# Patient Record
Sex: Female | Born: 1995 | Race: Black or African American | Hispanic: No | Marital: Single | Smoking: Never smoker
Health system: Southern US, Community
[De-identification: ages and names within clinical notes are randomized; demographics above are authoritative.]

## PROBLEM LIST (undated history)

## (undated) DIAGNOSIS — D573 Sickle-cell trait: Secondary | ICD-10-CM

## (undated) DIAGNOSIS — Z5189 Encounter for other specified aftercare: Secondary | ICD-10-CM

## (undated) HISTORY — PX: HERNIA REPAIR: SHX51

---
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
## 2015-12-17 ENCOUNTER — Emergency Department (HOSPITAL_COMMUNITY): Payer: Commercial Managed Care - HMO

## 2015-12-17 ENCOUNTER — Encounter (HOSPITAL_COMMUNITY): Payer: Self-pay | Admitting: Emergency Medicine

## 2015-12-17 ENCOUNTER — Emergency Department (HOSPITAL_COMMUNITY)
Admission: EM | Admit: 2015-12-17 | Discharge: 2015-12-17 | Disposition: A | Discharge status: Home / Self Care | Payer: Commercial Managed Care - HMO | Attending: Physician Assistant | Admitting: Physician Assistant

## 2015-12-17 DIAGNOSIS — J069 Acute upper respiratory infection, unspecified: Secondary | ICD-10-CM | POA: Insufficient documentation

## 2015-12-17 DIAGNOSIS — R509 Fever, unspecified: Secondary | ICD-10-CM | POA: Diagnosis present

## 2015-12-17 DIAGNOSIS — Z862 Personal history of diseases of the blood and blood-forming organs and certain disorders involving the immune mechanism: Secondary | ICD-10-CM | POA: Insufficient documentation

## 2015-12-17 LAB — RAPID STREP SCREEN (MED CTR MEBANE ONLY): STREPTOCOCCUS, GROUP A SCREEN (DIRECT): NEGATIVE

## 2015-12-17 LAB — MONONUCLEOSIS SCREEN: MONO SCREEN: NEGATIVE

## 2015-12-17 MED ORDER — ONDANSETRON HCL 4 MG PO TABS: 4.0000 mg | ORAL_TABLET | Freq: Four times a day (QID) | ORAL | Status: DC

## 2015-12-17 MED ORDER — HYDROCODONE-HOMATROPINE 5-1.5 MG/5ML PO SYRP: 5.0000 mL | ORAL_SOLUTION | Freq: Four times a day (QID) | ORAL | Status: DC | PRN

## 2015-12-17 MED ORDER — HYDROCODONE-HOMATROPINE 5-1.5 MG/5ML PO SYRP
5.0000 mL | ORAL_SOLUTION | Freq: Once | ORAL | Status: AC
  Administered 2015-12-17: 5 mL via ORAL
  Filled 2015-12-17: qty 5

## 2015-12-17 NOTE — ED Provider Notes (Signed)
CSN: [id number]     Arrival date & time 12/17/15  1105 History   First MD Initiated Contact with Patient 12/17/15 1111     Chief Complaint  Patient presents with  . Fever   (Consider location/radiation/quality/duration/timing/severity/associated sxs/prior Treatment) The history is provided by the patient and a parent. No language interpreter was used.  Ms. Knipe is a 19 y.o female with a history of sickle cell trait who presents for intermittent fever, productive white sputum cough, congestion, bodyaches, and one episode of vomiting a couple days ago. Mom states she has been sick for 2 long now. She also reports a sore throat. She is trying Mucinex, NyQuil, and cough drops with minimal relief. She states she does not get her menstrual cycle since she is on the NuvaRing. She reports multiple sick contacts at work. She denies any history of asthma or smoking.   Past Medical History  Diagnosis Date  . Sickle cell trait ([place])   . Blood transfusion without reported diagnosis    Past Surgical History  Procedure Laterality Date  . Hernia repair    . R ovanian dermoid cyst removed  06/10/13   No family history on file. Social History  Substance Use Topics  . Smoking status: Never Smoker   . Smokeless tobacco: None  . Alcohol Use: No   OB History    No data available     Review of Systems  Constitutional: Positive for fever.  HENT: Positive for sore throat.   Respiratory: Positive for cough. Negative for shortness of breath and wheezing.       Allergies  Review of patient's allergies indicates no known allergies.  Home Medications   Prior to Admission medications   Medication Sig Start Date End Date Taking? Authorizing Provider  etonogestrel-ethinyl estradiol (NUVARING) 0.12-0.015 MG/24HR vaginal ring Place 1 each vaginally every 28 (twenty-eight) days. 04/19/15  Yes Historical Provider, MD  Alpha-D-Galactosidase Satira Mccallum) TABS Take 1 tablet by mouth daily. Patient not taking:  Reported on 12/17/2015 06/12/13   Hazel Sams, PA-C  HYDROcodone-homatropine [facility]) 5-1.5 MG/5ML syrup Take 5 mLs by mouth every 6 (six) hours as needed for cough. 12/17/15   Lindia Garms Patel-Mills, PA-C  ondansetron (ZOFRAN) 4 MG tablet Take 1 tablet (4 mg total) by mouth every 6 (six) hours. 12/17/15   Anselm Aumiller Patel-Mills, PA-C  oxyCODONE-acetaminophen (PERCOCET/ROXICET) 5-325 MG per tablet Take 1 tablet by mouth every 4 (four) hours as needed for pain. Patient not taking: Reported on 12/17/2015 05/23/13   Virgel Manifold, MD  polyethylene glycol powder (GLYCOLAX/MIRALAX) powder Take 17 g by mouth daily. Patient not taking: Reported on 12/17/2015 06/12/13   Hazel Sams, PA-C   BP 97/81 mmHg  Pulse 98  Temp(Src) 98.4 F (36.9 C) (Oral)  Resp 20  SpO2 98% Physical Exam  Constitutional: She is oriented to person, place, and time. She appears well-developed and well-nourished.  HENT:  Head: Normocephalic and atraumatic.  No tonsillitis. Oropharynx is clear and moist. No tonsillar or oropharyngeal exudates. Uvula is midline. No trismus or hot potato voice.  Left anterior cervical lymphadenopathy that is tender on exam.  Bilateral TMs are normal.  Eyes: Conjunctivae are normal.  Neck: Normal range of motion. Neck supple.  Cardiovascular: Normal rate, regular rhythm and normal heart sounds.   Regular rate and rhythm. No murmur.  Pulmonary/Chest: Effort normal and breath sounds normal. No respiratory distress. She has no wheezes. She has no rales.  Lungs clear to auscultation bilaterally. No respiratory distress or accessory muscle usage. She  is speaking full sentences.  Abdominal: Soft. There is no tenderness.  Abdomen is soft and nontender.  Musculoskeletal: Normal range of motion.  Neurological: She is alert and oriented to person, place, and time.  Skin: Skin is warm and dry.  Nursing note and vitals reviewed.   ED Course  Procedures (including critical care time) Labs Review Labs  Reviewed  RAPID STREP SCREEN (NOT AT [facility])  CULTURE, GROUP A STREP  MONONUCLEOSIS SCREEN    Imaging Review Dg Chest 2 View  12/17/2015  CLINICAL DATA:  Shortness of breath, cough, fever, nausea and vomiting EXAM: CHEST  2 VIEW COMPARISON:  06/12/2013 FINDINGS: Cardiomediastinal silhouette is stable. No acute infiltrate or pleural effusion. No pulmonary edema. Bony thorax is unremarkable. IMPRESSION: No active cardiopulmonary disease. Electronically Signed   By: Lahoma Crocker M.D.   On: 12/17/2015 11:35   I have personally reviewed and evaluated these images and lab results as part of my medical decision-making.   EKG Interpretation None      MDM   Final diagnoses:  URI (upper respiratory infection)   She presents for upper respiratory symptoms including fever, congestion, body aches, and productive cough 2 weeks. Due to the length of illness I will obtain a strep, mono, and chest x-ray. She is well-appearing and in no acute distress. Her vital signs are stable. She is afebrile. She is not to And her oxygen is 98% on room air.  Chest x-ray is negative for pneumonia. Rapid strep and mono screen is negative. I do not believe this is a bacterial infection. She states that many people are sick at work. This is most likely viral. I will treat her symptomatically with Hycodan for her cough. I also explained that if she developed a fever she should take Tylenol or Motrin. She was given Zofran for nausea. He was able to tolerate by mouth fluids in the ED without vomiting. Return precautions as well as follow-up were discussed. Patient agrees with plan. Medications  HYDROcodone-homatropine (HYCODAN) 5-1.5 MG/5ML syrup 5 mL (5 mLs Oral Given 12/17/15 1146)       Ottie Glazier, PA-C 12/17/15 1422  Courteney Lyn Mackuen, MD 12/17/15 1510

## 2015-12-17 NOTE — Discharge Instructions (Signed)
Upper Respiratory Infection, Adult Do not use Hycodan when driving or operating machinery. It may make you sleepy. Take Tylenol or Motrin if you develop a fever. Most upper respiratory infections (URIs) are a viral infection of the air passages leading to the lungs. A URI affects the nose, throat, and upper air passages. The most common type of URI is nasopharyngitis and is typically referred to as "the common cold." URIs run their course and usually go away on their own. Most of the time, a URI does not require medical attention, but sometimes a bacterial infection in the upper airways can follow a viral infection. This is called a secondary infection. Sinus and middle ear infections are common types of secondary upper respiratory infections. Bacterial pneumonia can also complicate a URI. A URI can worsen asthma and chronic obstructive pulmonary disease (COPD). Sometimes, these complications can require emergency medical care and may be life threatening.  CAUSES Almost all URIs are caused by viruses. A virus is a type of germ and can spread from one person to another.  RISKS FACTORS You may be at risk for a URI if:   You smoke.   You have chronic heart or lung disease.  You have a weakened defense (immune) system.   You are very young or very old.   You have nasal allergies or asthma.  You work in crowded or poorly ventilated areas.  You work in health care facilities or schools. SIGNS AND SYMPTOMS  Symptoms typically develop 2-3 days after you come in contact with a cold virus. Most viral URIs last 7-10 days. However, viral URIs from the influenza virus (flu virus) can last 14-18 days and are typically more severe. Symptoms may include:   Runny or stuffy (congested) nose.   Sneezing.   Cough.   Sore throat.   Headache.   Fatigue.   Fever.   Loss of appetite.   Pain in your forehead, behind your eyes, and over your cheekbones (sinus pain).  Muscle aches.   DIAGNOSIS  Your health care provider may diagnose a URI by:  Physical exam.  Tests to check that your symptoms are not due to another condition such as:  Strep throat.  Sinusitis.  Pneumonia.  Asthma. TREATMENT  A URI goes away on its own with time. It cannot be cured with medicines, but medicines may be prescribed or recommended to relieve symptoms. Medicines may help:  Reduce your fever.  Reduce your cough.  Relieve nasal congestion. HOME CARE INSTRUCTIONS   Take medicines only as directed by your health care provider.   Gargle warm saltwater or take cough drops to comfort your throat as directed by your health care provider.  Use a warm mist humidifier or inhale steam from a shower to increase air moisture. This may make it easier to breathe.  Drink enough fluid to keep your urine clear or pale yellow.   Eat soups and other clear broths and maintain good nutrition.   Rest as needed.   Return to work when your temperature has returned to normal or as your health care provider advises. You may need to stay home longer to avoid infecting others. You can also use a face mask and careful hand washing to prevent spread of the virus.  Increase the usage of your inhaler if you have asthma.   Do not use any tobacco products, including cigarettes, chewing tobacco, or electronic cigarettes. If you need help quitting, ask your health care provider. PREVENTION  The best way  to protect yourself from getting a cold is to practice good hygiene.   Avoid oral or hand contact with people with cold symptoms.   Wash your hands often if contact occurs.  There is no clear evidence that vitamin C, vitamin E, echinacea, or exercise reduces the chance of developing a cold. However, it is always recommended to get plenty of rest, exercise, and practice good nutrition.  SEEK MEDICAL CARE IF:   You are getting worse rather than better.   Your symptoms are not controlled by  medicine.   You have chills.  You have worsening shortness of breath.  You have brown or red mucus.  You have yellow or brown nasal discharge.  You have pain in your face, especially when you bend forward.  You have a fever.  You have swollen neck glands.  You have pain while swallowing.  You have white areas in the back of your throat. SEEK IMMEDIATE MEDICAL CARE IF:   You have severe or persistent:  Headache.  Ear pain.  Sinus pain.  Chest pain.  You have chronic lung disease and any of the following:  Wheezing.  Prolonged cough.  Coughing up blood.  A change in your usual mucus.  You have a stiff neck.  You have changes in your:  Vision.  Hearing.  Thinking.  Mood. MAKE SURE YOU:   Understand these instructions.  Will watch your condition.  Will get help right away if you are not doing well or get worse.   This information is not intended to replace advice given to you by your health care provider. Make sure you discuss any questions you have with your health care provider.   Document Released: 06/04/2001 Document Revised: 04/25/2015 Document Reviewed: 03/16/2014 Elsevier Interactive Patient Education [employer].

## 2015-12-17 NOTE — ED Notes (Signed)
Per mother, fever, congestion, body aches, N/V on and off for two weeks-no relief with OTC meds

## 2015-12-19 LAB — CULTURE, GROUP A STREP: Strep A Culture: NEGATIVE

## 2016-02-29 ENCOUNTER — Emergency Department (HOSPITAL_COMMUNITY)
Admission: EM | Admit: 2016-02-29 | Discharge: 2016-02-29 | Disposition: A | Discharge status: Home / Self Care | Payer: Commercial Managed Care - HMO | Attending: Emergency Medicine | Admitting: Emergency Medicine

## 2016-02-29 ENCOUNTER — Emergency Department (HOSPITAL_COMMUNITY): Payer: Commercial Managed Care - HMO

## 2016-02-29 ENCOUNTER — Encounter (HOSPITAL_COMMUNITY): Payer: Self-pay | Admitting: Emergency Medicine

## 2016-02-29 DIAGNOSIS — N390 Urinary tract infection, site not specified: Secondary | ICD-10-CM | POA: Insufficient documentation

## 2016-02-29 DIAGNOSIS — R000 Tachycardia, unspecified: Secondary | ICD-10-CM | POA: Insufficient documentation

## 2016-02-29 DIAGNOSIS — Z862 Personal history of diseases of the blood and blood-forming organs and certain disorders involving the immune mechanism: Secondary | ICD-10-CM | POA: Diagnosis not present

## 2016-02-29 DIAGNOSIS — R109 Unspecified abdominal pain: Secondary | ICD-10-CM | POA: Diagnosis present

## 2016-02-29 DIAGNOSIS — Z3202 Encounter for pregnancy test, result negative: Secondary | ICD-10-CM | POA: Insufficient documentation

## 2016-02-29 DIAGNOSIS — R1084 Generalized abdominal pain: Secondary | ICD-10-CM

## 2016-02-29 LAB — CBC
HCT: 34.6 % — ABNORMAL LOW (ref 36.0–46.0)
HEMOGLOBIN: 10.7 g/dL — AB (ref 12.0–15.0)
MCH: 21.5 pg — AB (ref 26.0–34.0)
MCHC: 30.9 g/dL (ref 30.0–36.0)
MCV: 69.6 fL — ABNORMAL LOW (ref 78.0–100.0)
Platelets: 407 10*3/uL — ABNORMAL HIGH (ref 150–400)
RBC: 4.97 MIL/uL (ref 3.87–5.11)
RDW: 15.8 % — AB (ref 11.5–15.5)
WBC: 7.7 10*3/uL (ref 4.0–10.5)

## 2016-02-29 LAB — URINALYSIS, ROUTINE W REFLEX MICROSCOPIC
BILIRUBIN URINE: NEGATIVE
Glucose, UA: NEGATIVE mg/dL
KETONES UR: 40 mg/dL — AB
NITRITE: POSITIVE — AB
Protein, ur: 100 mg/dL — AB
SPECIFIC GRAVITY, URINE: 1.013 (ref 1.005–1.030)
pH: 6 (ref 5.0–8.0)

## 2016-02-29 LAB — COMPREHENSIVE METABOLIC PANEL
ALBUMIN: 3.4 g/dL — AB (ref 3.5–5.0)
ALT: 12 U/L — ABNORMAL LOW (ref 14–54)
AST: 18 U/L (ref 15–41)
Alkaline Phosphatase: 44 U/L (ref 38–126)
Anion gap: 11 (ref 5–15)
BILIRUBIN TOTAL: 0.6 mg/dL (ref 0.3–1.2)
CALCIUM: 8.5 mg/dL — AB (ref 8.9–10.3)
CO2: 20 mmol/L — AB (ref 22–32)
CREATININE: 0.79 mg/dL (ref 0.44–1.00)
Chloride: 105 mmol/L (ref 101–111)
GLUCOSE: 82 mg/dL (ref 65–99)
Potassium: 3.7 mmol/L (ref 3.5–5.1)
Sodium: 136 mmol/L (ref 135–145)
TOTAL PROTEIN: 7 g/dL (ref 6.5–8.1)

## 2016-02-29 LAB — LIPASE, BLOOD: Lipase: 19 U/L (ref 11–51)

## 2016-02-29 LAB — URINE MICROSCOPIC-ADD ON: SQUAMOUS EPITHELIAL / LPF: NONE SEEN

## 2016-02-29 LAB — I-STAT BETA HCG BLOOD, ED (MC, WL, AP ONLY)

## 2016-02-29 MED ORDER — IOHEXOL 300 MG/ML  SOLN: 25.0000 mL | Freq: Once | INTRAMUSCULAR | Status: DC | PRN

## 2016-02-29 MED ORDER — ONDANSETRON 4 MG PO TBDP
4.0000 mg | ORAL_TABLET | Freq: Once | ORAL | Status: AC
  Administered 2016-02-29: 4 mg via ORAL
  Filled 2016-02-29: qty 1

## 2016-02-29 MED ORDER — SODIUM CHLORIDE 0.9 % IV BOLUS (SEPSIS)
1000.0000 mL | Freq: Once | INTRAVENOUS | Status: AC
  Administered 2016-02-29: 1000 mL via INTRAVENOUS

## 2016-02-29 MED ORDER — MORPHINE SULFATE (PF) 4 MG/ML IV SOLN
4.0000 mg | Freq: Once | INTRAVENOUS | Status: AC
  Administered 2016-02-29: 4 mg via INTRAVENOUS
  Filled 2016-02-29: qty 1

## 2016-02-29 MED ORDER — KETOROLAC TROMETHAMINE 15 MG/ML IJ SOLN
15.0000 mg | Freq: Once | INTRAMUSCULAR | Status: AC
  Administered 2016-02-29: 15 mg via INTRAVENOUS
  Filled 2016-02-29: qty 1

## 2016-02-29 MED ORDER — DEXTROSE 5 % IV SOLN
1.0000 g | Freq: Once | INTRAVENOUS | Status: AC
  Administered 2016-02-29: 1 g via INTRAVENOUS
  Filled 2016-02-29: qty 10

## 2016-02-29 MED ORDER — CEPHALEXIN 500 MG PO CAPS: 500.0000 mg | ORAL_CAPSULE | Freq: Three times a day (TID) | ORAL | Status: DC

## 2016-02-29 MED ORDER — IOHEXOL 300 MG/ML  SOLN
100.0000 mL | Freq: Once | INTRAMUSCULAR | Status: AC | PRN
  Administered 2016-02-29: 100 mL via INTRAVENOUS

## 2016-02-29 MED ORDER — ONDANSETRON HCL 4 MG/2ML IJ SOLN
4.0000 mg | Freq: Once | INTRAMUSCULAR | Status: AC
  Administered 2016-02-29: 4 mg via INTRAVENOUS
  Filled 2016-02-29: qty 2

## 2016-02-29 MED ORDER — ONDANSETRON 4 MG PO TBDP: 4.0000 mg | ORAL_TABLET | ORAL | Status: DC | PRN

## 2016-02-29 MED ORDER — OXYCODONE-ACETAMINOPHEN 5-325 MG PO TABS
1.0000 | ORAL_TABLET | Freq: Once | ORAL | Status: AC
  Administered 2016-02-29: 1 via ORAL
  Filled 2016-02-29: qty 1

## 2016-02-29 NOTE — ED Provider Notes (Signed)
I have reviewed the patient's results with her. She is informed of CT results and urinalysis which is grossly positive for UTI. Dr. Wilson Singer planned for discharge with Keflex for UTI. Patient has been given Rocephin IV in the emergency department. She is nontoxic and alert with stable vital signs. At this time I will add Zofran to discharge medications for associated nausea and vomiting. Patient is counseled to return if she is having difficulty tolerating oral intake worsening pain or other concerning symptoms.  Charlesetta Shanks, MD 02/29/16 [phone]

## 2016-02-29 NOTE — ED Notes (Signed)
Per pt, states abdominal pain, chills, vomiting since Monday-[facility] here from [facility] for [place] work up

## 2016-02-29 NOTE — Discharge Instructions (Signed)

## 2016-03-03 LAB — URINE CULTURE: Culture: >=100000

## 2016-03-04 ENCOUNTER — Telehealth (HOSPITAL_BASED_OUTPATIENT_CLINIC_OR_DEPARTMENT_OTHER): Payer: Self-pay | Admitting: Emergency Medicine

## 2016-03-04 NOTE — Telephone Encounter (Signed)
Post ED Visit - Positive Culture Follow-up  Culture report reviewed by antimicrobial stewardship pharmacist:  []  Elenor Quinones, Pharm.D. []  Heide Guile, Pharm.D., BCPS []  Parks Neptune, Pharm.D. []  Alycia Rossetti, Pharm.D., BCPS []  [place], [place].D., BCPS, AAHIVP []  Legrand Como, Pharm.D., BCPS, AAHIVP []  Milus Glazier, Pharm.D. []  Stephens November, Pharm.DGovernor Specking PharmD  Positive urine culture E coli Treated with cephalexin, organism sensitive to the same and no further patient follow-up is required at this time.  Hazle Nordmann 03/04/2016, 11:20 AM

## 2016-03-07 NOTE — ED Provider Notes (Signed)
CSN: [id number]     Arrival date & time 02/29/16  1137 History   First MD Initiated Contact with Patient 02/29/16 1410     Chief Complaint  Patient presents with  . Abdominal Pain     (Consider location/radiation/quality/duration/timing/severity/associated sxs/prior Treatment) HPI   20yF with abdominal pain. Gradual onset Monday. Says he would nausea and vomited once. Nonbilious/nonbloody emesis. Increased urinary frequency. Denies any dysuria. No unusual vaginal bleeding or discharge. Seen at urgent care prior to arrival referred to the emergency room for concern of appendicitis.  Past Medical History  Diagnosis Date  . Sickle cell trait ([place])   . Blood transfusion without reported diagnosis    Past Surgical History  Procedure Laterality Date  . Hernia repair    . R ovanian dermoid cyst removed  06/10/13   No family history on file. Social History  Substance Use Topics  . Smoking status: Never Smoker   . Smokeless tobacco: None  . Alcohol Use: No   OB History    No data available     Review of Systems  All systems reviewed and negative, other than as noted in HPI.   Allergies  Review of patient's allergies indicates no known allergies.  Home Medications   Prior to Admission medications   Medication Sig Start Date End Date Taking? Authorizing Provider  etonogestrel-ethinyl estradiol (NUVARING) 0.12-0.015 MG/24HR vaginal ring Place 1 each vaginally every 28 (twenty-eight) days. 04/19/15  Yes Historical Provider, MD  cephALEXin (KEFLEX) 500 MG capsule Take 1 capsule (500 mg total) by mouth 3 (three) times daily. 02/29/16   Virgel Manifold, MD  HYDROcodone-homatropine [facility]) 5-1.5 MG/5ML syrup Take 5 mLs by mouth every 6 (six) hours as needed for cough. Patient not taking: Reported on 02/29/2016 12/17/15   Ottie Glazier, PA-C  ondansetron (ZOFRAN ODT) 4 MG disintegrating tablet Take 1 tablet (4 mg total) by mouth every 4 (four) hours as needed for nausea or vomiting.  02/29/16   Charlesetta Shanks, MD  ondansetron (ZOFRAN) 4 MG tablet Take 1 tablet (4 mg total) by mouth every 6 (six) hours. Patient not taking: Reported on 02/29/2016 12/17/15   Ottie Glazier, PA-C  oxyCODONE-acetaminophen (PERCOCET/ROXICET) 5-325 MG per tablet Take 1 tablet by mouth every 4 (four) hours as needed for pain. Patient not taking: Reported on 12/17/2015 05/23/13   Virgel Manifold, MD  polyethylene glycol powder (GLYCOLAX/MIRALAX) powder Take 17 g by mouth daily. Patient not taking: Reported on 12/17/2015 06/12/13   Hazel Sams, PA-C   BP 112/73 mmHg  Pulse 89  Temp(Src) 98.2 F (36.8 C) (Oral)  Resp 17  Ht 5\' 3"  (1.6 m)  Wt 116 lb (52.617 kg)  BMI 20.55 kg/m2  SpO2 100% Physical Exam  Constitutional: She appears well-developed and well-nourished. No distress.  HENT:  Head: Normocephalic and atraumatic.  Eyes: Conjunctivae are normal. Right eye exhibits no discharge. Left eye exhibits no discharge.  Neck: Neck supple.  Cardiovascular: Regular rhythm and normal heart sounds.  Exam reveals no gallop and no friction rub.   No murmur heard. Mild tachycardia  Pulmonary/Chest: Effort normal and breath sounds normal. No respiratory distress.  Abdominal: Soft. She exhibits no distension. There is tenderness.  Mild diffuse tenderness without rebound or guarding. No distention.  Musculoskeletal: She exhibits no edema or tenderness.  Neurological: She is alert.  Skin: Skin is warm and dry.  Psychiatric: She has a normal mood and affect. Her behavior is normal. Thought content normal.  Nursing note and vitals reviewed.   ED  Course  Procedures (including critical care time) Labs Review Labs Reviewed  COMPREHENSIVE METABOLIC PANEL - Abnormal; Notable for the following:    CO2 20 (*)    BUN <5 (*)    Calcium 8.5 (*)    Albumin 3.4 (*)    ALT 12 (*)    All other components within normal limits  CBC - Abnormal; Notable for the following:    Hemoglobin 10.7 (*)    HCT 34.6 (*)     MCV 69.6 (*)    MCH 21.5 (*)    RDW 15.8 (*)    Platelets 407 (*)    All other components within normal limits  URINALYSIS, ROUTINE W REFLEX MICROSCOPIC (NOT AT [facility]) - Abnormal; Notable for the following:    APPearance TURBID (*)    Hgb urine dipstick LARGE (*)    Ketones, ur 40 (*)    Protein, ur 100 (*)    Nitrite POSITIVE (*)    Leukocytes, UA MODERATE (*)    All other components within normal limits  URINE MICROSCOPIC-ADD ON - Abnormal; Notable for the following:    Bacteria, UA FEW (*)    All other components within normal limits  URINE CULTURE  LIPASE, BLOOD  I-STAT BETA HCG BLOOD, ED ([facility], [facility], [facility] ONLY)    Imaging Review No results found.   Ct Abdomen Pelvis W Contrast  02/29/2016  CLINICAL DATA:  Abdominal pain.  Sickle cell trait EXAM: CT ABDOMEN AND PELVIS WITH CONTRAST TECHNIQUE: Multidetector CT imaging of the abdomen and pelvis was performed using the standard protocol following bolus administration of intravenous contrast. CONTRAST:  181mL OMNIPAQUE IOHEXOL 300 MG/ML  SOLN COMPARISON:  CT 06/12/2013 FINDINGS: Lower chest:  Lung bases clear Hepatobiliary: Liver normal in size and contour without focal lesion. Gallbladder and bile ducts normal. Pancreas: Negative Spleen: Negative Adrenals/Urinary Tract: Normal renal size and contour. No obstruction or mass. No urinary tract calculi. Urinary bladder mildly thickened but without focal abnormality. Stomach/Bowel: Negative for bowel obstruction. No bowel wall edema. Normal appendix. Vascular/Lymphatic: Normal aorta and IVC.  No lymphadenopathy. Reproductive: Cervical diaphragm noted. Normal uterus. No eighth adnexal mass or cyst. Small amount of free fluid in the pelvis likely physiologic. Other: Negative for mass lesion. Prior umbilical hernia repair with clips. No recurrent hernia. Musculoskeletal: Negative IMPRESSION: Small amount of free fluid in the pelvis likely is physiologic. No acute abnormality Normal appendix Electronically  Signed   By: Franchot Gallo M.D.   On: 02/29/2016 17:13   I have personally reviewed and evaluated these images and lab results as part of my medical decision-making.   EKG Interpretation None      MDM   Final diagnoses:  Generalized abdominal pain  UTI (lower urinary tract infection)    20yf with abdominal pain. Likely from UTI. UA consistent with this. CT reassuring. I feel appropriate for outpt tx.     Virgel Manifold, MD 03/07/16 [phone]

## 2016-08-06 IMAGING — CT CT ABD-PELV W/ CM
2 of 4 series · 16 of 46 positions shown, 18 images · IV contrast (OMNIPAQUE 300)
Comparison: CT 06/12/2013

CLINICAL DATA: Abdominal pain.  Sickle cell trait

EXAM:
CT ABDOMEN AND PELVIS WITH CONTRAST
TECHNIQUE: Multidetector CT imaging of the abdomen and pelvis was performed
using the standard protocol following bolus administration of
intravenous contrast.
CONTRAST:  100mL OMNIPAQUE IOHEXOL 300 MG/ML  SOLN

[Series 2: abd/pel with · axial · 0.58mm/px · z∈[-235,+140]mm · 13 of 83 slices shown, 15 images]
[im 4/83  soft-tissue]
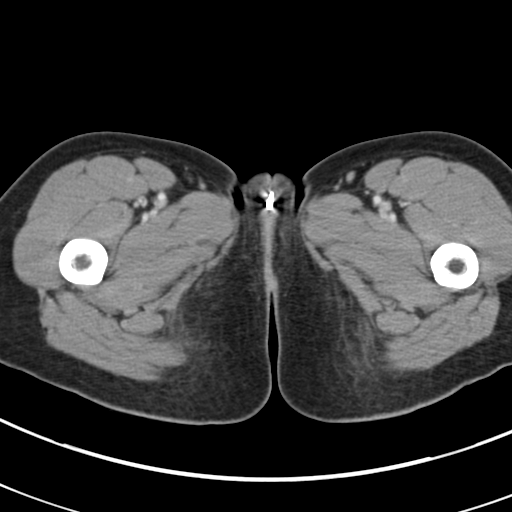
[im 4/83  bone]
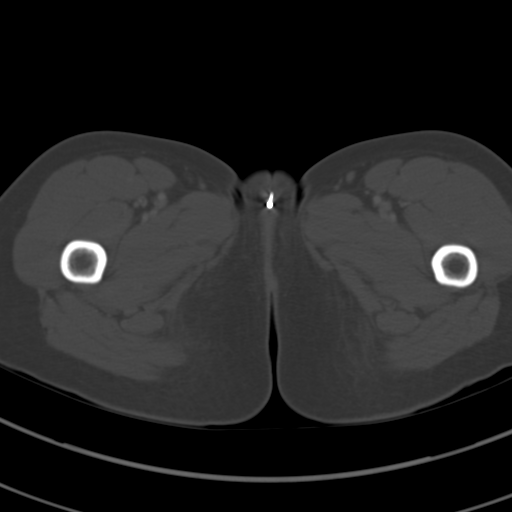
[im 10/83  soft-tissue]
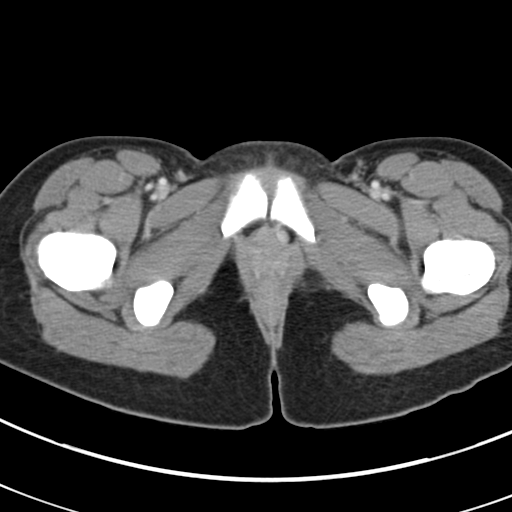
[im 16/83  soft-tissue]
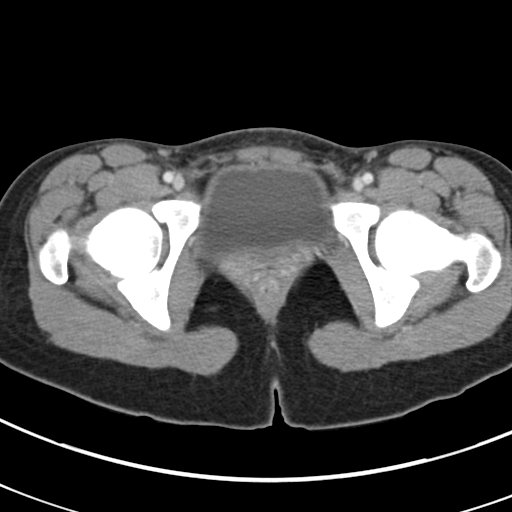
[im 23/83  soft-tissue]
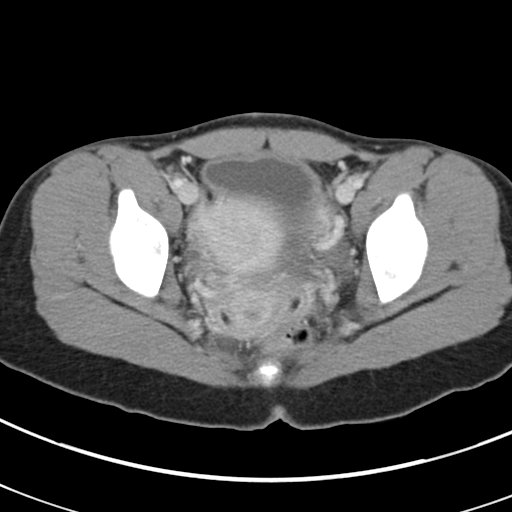
[im 29/83  soft-tissue]
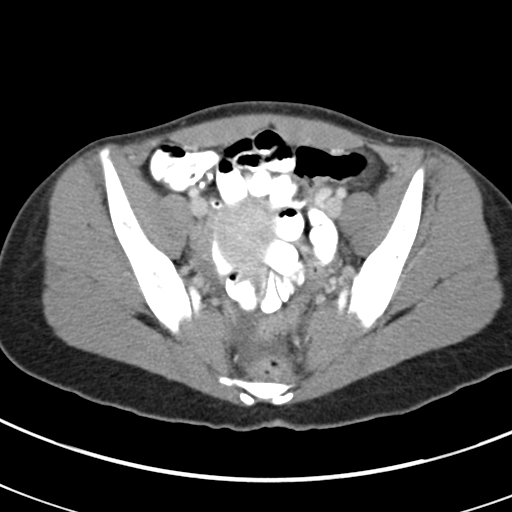
[im 35/83  soft-tissue]
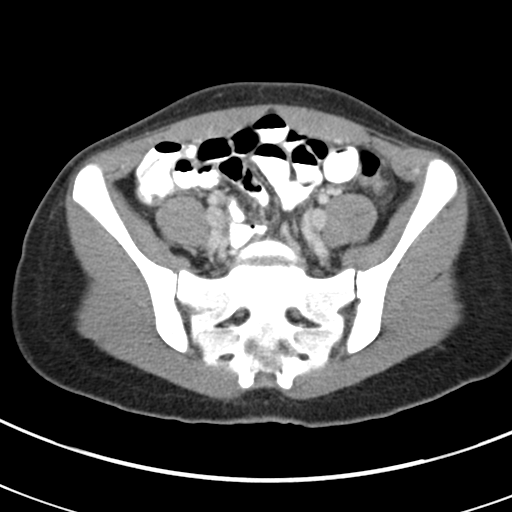
[im 42/83  soft-tissue]
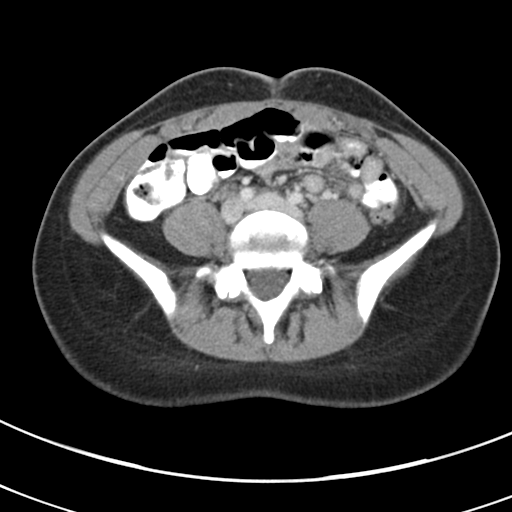
[im 48/83  soft-tissue]
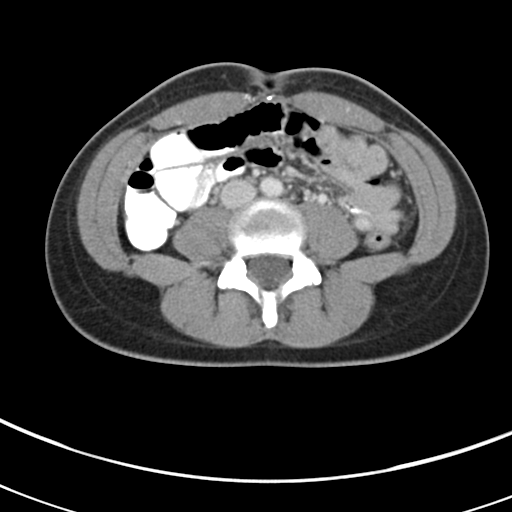
[im 54/83  soft-tissue]
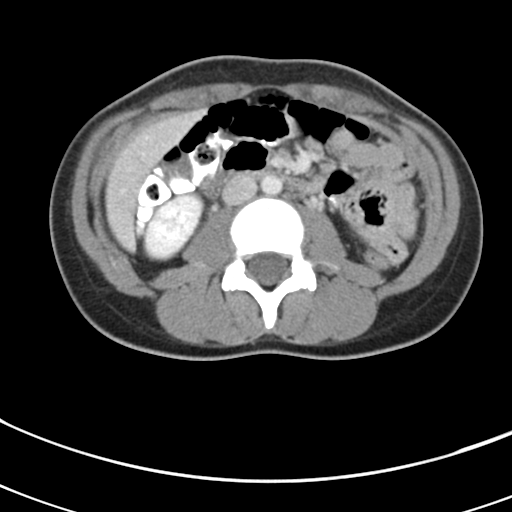
[im 54/83  bone]
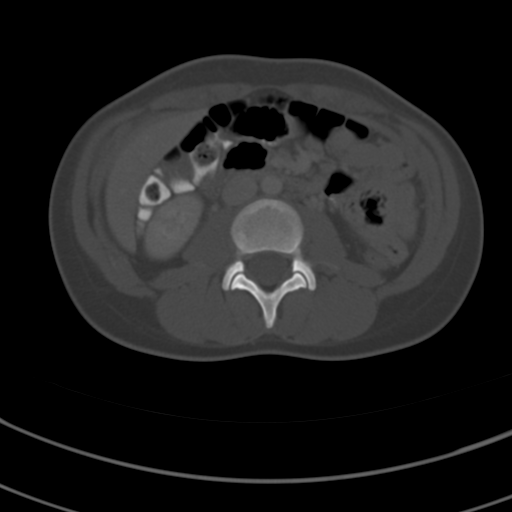
[im 60/83  soft-tissue]
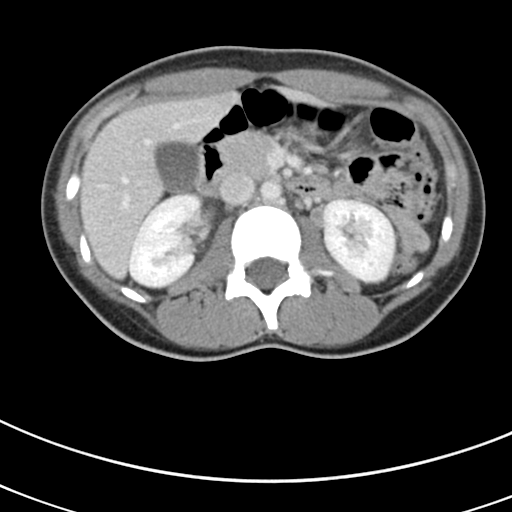
[im 67/83  soft-tissue]
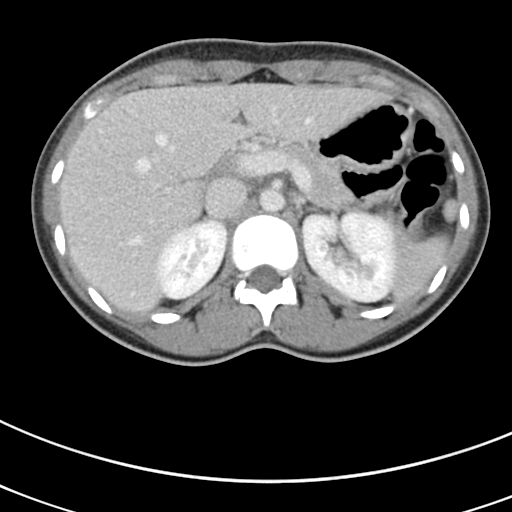
[im 73/83  soft-tissue]
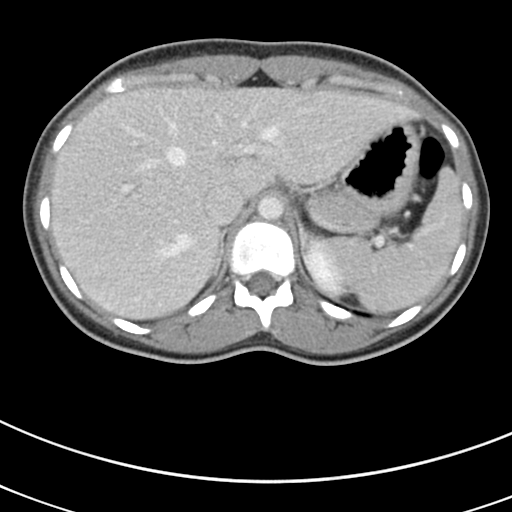
[im 79/83  soft-tissue]
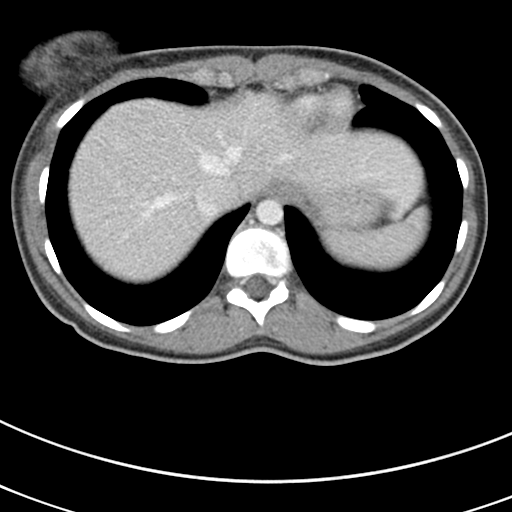

[Series 3: coronal a/|p · coronal · 0.56mm/px · 3 of 78 slices shown]
[im 26/78  soft-tissue]
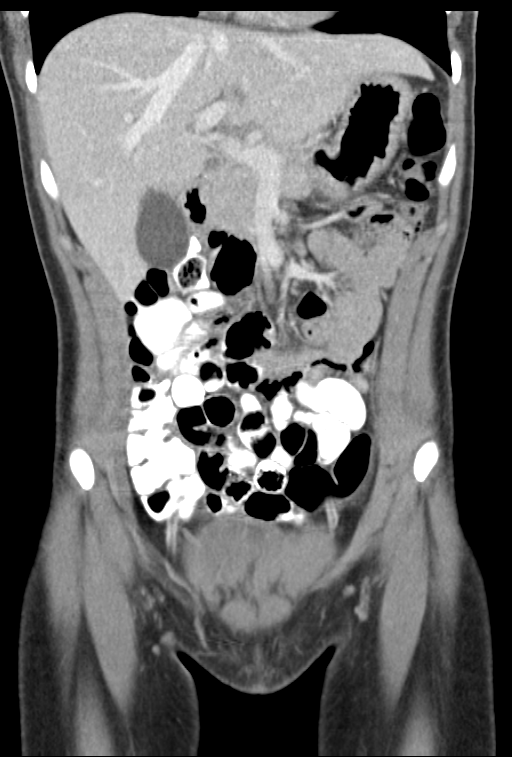
[im 35/78  soft-tissue]
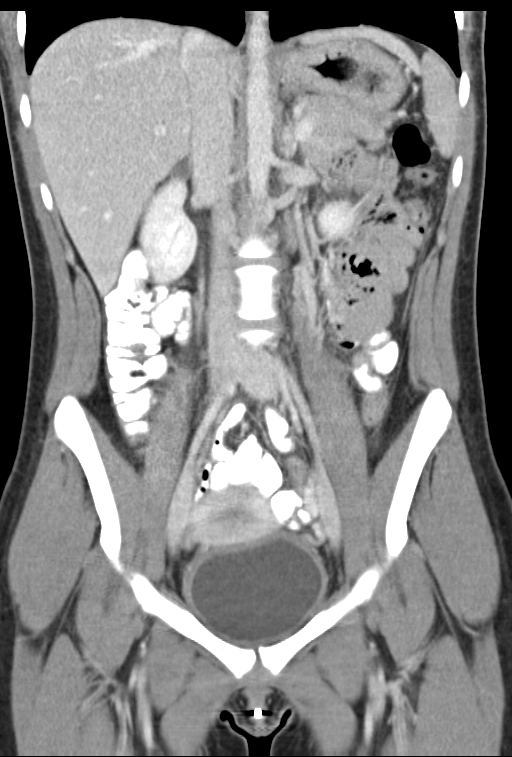
[im 43/78  soft-tissue]
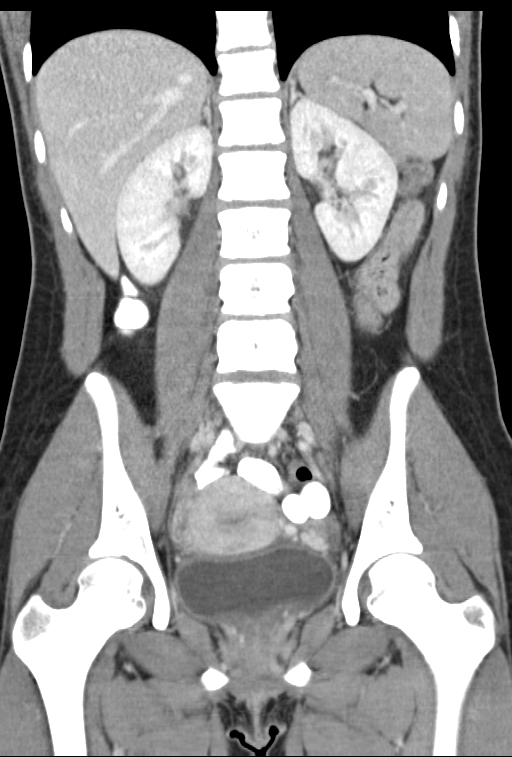

[16 of 46 positions shown; findings below may reference images not displayed]

FINDINGS: Lower chest:  Lung bases clear

Hepatobiliary: Liver normal in size and contour without focal
lesion. Gallbladder and bile ducts normal.

Pancreas: Negative

Spleen: Negative

Adrenals/Urinary Tract: Normal renal size and contour. No
obstruction or mass. No urinary tract calculi. Urinary bladder
mildly thickened but without focal abnormality.

Stomach/Bowel: Negative for bowel obstruction. No bowel wall edema.
Normal appendix.

Vascular/Lymphatic: Normal aorta and IVC.  No lymphadenopathy.

Reproductive: Cervical diaphragm noted. Normal uterus. No eighth
adnexal mass or cyst. Small amount of free fluid in the pelvis
likely physiologic.

Other: Negative for mass lesion. Prior umbilical hernia repair with
clips. No recurrent hernia.

Musculoskeletal: Negative
IMPRESSION: Small amount of free fluid in the pelvis likely is physiologic. No
acute abnormality

Normal appendix

## 2016-10-06 ENCOUNTER — Emergency Department (HOSPITAL_COMMUNITY): Payer: Commercial Managed Care - HMO

## 2016-10-06 ENCOUNTER — Encounter (HOSPITAL_COMMUNITY): Payer: Self-pay

## 2016-10-06 ENCOUNTER — Emergency Department (HOSPITAL_COMMUNITY)
Admission: EM | Admit: 2016-10-06 | Discharge: 2016-10-07 | Disposition: A | Discharge status: Home / Self Care | Payer: Commercial Managed Care - HMO | Attending: Emergency Medicine | Admitting: Emergency Medicine

## 2016-10-06 DIAGNOSIS — Z3A01 Less than 8 weeks gestation of pregnancy: Secondary | ICD-10-CM | POA: Diagnosis not present

## 2016-10-06 DIAGNOSIS — R52 Pain, unspecified: Secondary | ICD-10-CM

## 2016-10-06 DIAGNOSIS — O209 Hemorrhage in early pregnancy, unspecified: Secondary | ICD-10-CM | POA: Diagnosis not present

## 2016-10-06 DIAGNOSIS — N939 Abnormal uterine and vaginal bleeding, unspecified: Secondary | ICD-10-CM

## 2016-10-06 LAB — URINALYSIS, ROUTINE W REFLEX MICROSCOPIC
BILIRUBIN URINE: NEGATIVE
Glucose, UA: NEGATIVE mg/dL
KETONES UR: 40 mg/dL — AB
NITRITE: NEGATIVE
PROTEIN: NEGATIVE mg/dL
Specific Gravity, Urine: 1.022 (ref 1.005–1.030)
pH: 5.5 (ref 5.0–8.0)

## 2016-10-06 LAB — CBC
HCT: 35.1 % — ABNORMAL LOW (ref 36.0–46.0)
Hemoglobin: 11.5 g/dL — ABNORMAL LOW (ref 12.0–15.0)
MCH: 22.5 pg — ABNORMAL LOW (ref 26.0–34.0)
MCHC: 32.8 g/dL (ref 30.0–36.0)
MCV: 68.6 fL — ABNORMAL LOW (ref 78.0–100.0)
Platelets: 442 10*3/uL — ABNORMAL HIGH (ref 150–400)
RBC: 5.12 MIL/uL — ABNORMAL HIGH (ref 3.87–5.11)
RDW: 17 % — ABNORMAL HIGH (ref 11.5–15.5)
WBC: 5.9 10*3/uL (ref 4.0–10.5)

## 2016-10-06 LAB — COMPREHENSIVE METABOLIC PANEL
ALT: 12 U/L — ABNORMAL LOW (ref 14–54)
AST: 19 U/L (ref 15–41)
Albumin: 3.8 g/dL (ref 3.5–5.0)
Alkaline Phosphatase: 49 U/L (ref 38–126)
Anion gap: 9 (ref 5–15)
BUN: 5 mg/dL — ABNORMAL LOW (ref 6–20)
CO2: 22 mmol/L (ref 22–32)
Calcium: 9.8 mg/dL (ref 8.9–10.3)
Chloride: 106 mmol/L (ref 101–111)
Creatinine, Ser: 0.69 mg/dL (ref 0.44–1.00)
GFR calc Af Amer: >60 mL/min (ref >60)
GFR calc non Af Amer: >60 mL/min (ref >60)
Glucose, Bld: 85 mg/dL (ref 65–99)
Potassium: 3.9 mmol/L (ref 3.5–5.1)
Sodium: 137 mmol/L (ref 135–145)
Total Bilirubin: 1.5 mg/dL — ABNORMAL HIGH (ref 0.3–1.2)
Total Protein: 7.1 g/dL (ref 6.5–8.1)

## 2016-10-06 LAB — WET PREP, GENITAL
Clue Cells Wet Prep HPF POC: NONE SEEN
Sperm: NONE SEEN
Trich, Wet Prep: NONE SEEN
WBC, Wet Prep HPF POC: NONE SEEN
Yeast Wet Prep HPF POC: NONE SEEN

## 2016-10-06 LAB — LIPASE, BLOOD: Lipase: 23 U/L (ref 11–51)

## 2016-10-06 LAB — TYPE AND SCREEN
ABO/RH(D): O POS
Antibody Screen: NEGATIVE

## 2016-10-06 LAB — URINE MICROSCOPIC-ADD ON

## 2016-10-06 LAB — HCG, QUANTITATIVE, PREGNANCY: hCG, Beta Chain, Quant, S: 234644 m[IU]/mL — ABNORMAL HIGH (ref <5)

## 2016-10-06 LAB — I-STAT BETA HCG BLOOD, ED (MC, WL, AP ONLY)

## 2016-10-06 MED ORDER — ACETAMINOPHEN 325 MG PO TABS
650.0000 mg | ORAL_TABLET | Freq: Once | ORAL | Status: AC
  Administered 2016-10-06: 650 mg via ORAL
  Filled 2016-10-06: qty 2

## 2016-10-06 NOTE — ED Provider Notes (Signed)
[facility] DEPT Provider Note   CSN: [id number] Arrival date & time: 10/06/16  1547     History   Chief Complaint Chief Complaint  Patient presents with  . Abdominal Pain  . Vaginal Bleeding    HPI Patty Garcia is a 20 y.o. female.  The history is provided by the patient and medical records.  Abdominal Pain    Vaginal Bleeding  Primary symptoms include vaginal bleeding. Associated symptoms include abdominal pain (cramping).    20 year old female G1P0 approx [redacted] weeks gestation based on  LMP, presenting to the ED for vaginal bleeding.  Patient states bleeding began this morning, has been somewhat heavy with small clots intermixed.  States she has had some lower abdominal cramping as well as nausea and vomiting today.  No urinary issues.  No vaginal discharge.  No diarrhea.  No fever or chills.  Her OB-GYN is in [place].  Patient reports that she went to an abortion clinic at the end of last week and took a "pill" to end pregnancy.  States she was <9 weeks so was told this was acceptable.  States she was concerned regarding the amount of bleeding she was having and intensity of the cramping as she was told it would "not be bad".  Past Medical History:  Diagnosis Date  . Blood transfusion without reported diagnosis   . Sickle cell trait ([place])     There are no active problems to display for this patient.   Past Surgical History:  Procedure Laterality Date  . HERNIA REPAIR    . R ovanian dermoid cyst removed  06/10/13    OB History    Gravida Para Term Preterm AB Living   1             SAB TAB Ectopic Multiple Live Births                   Home Medications    Prior to Admission medications   Medication Sig Start Date End Date Taking? Authorizing Provider  cephALEXin (KEFLEX) 500 MG capsule Take 1 capsule (500 mg total) by mouth 3 (three) times daily. 02/29/16   Virgel Manifold, MD  etonogestrel-ethinyl estradiol (NUVARING) 0.12-0.015 MG/24HR vaginal ring  Place 1 each vaginally every 28 (twenty-eight) days. 04/19/15   Historical Provider, MD  HYDROcodone-homatropine (HYCODAN) 5-1.5 MG/5ML syrup Take 5 mLs by mouth every 6 (six) hours as needed for cough. Patient not taking: Reported on 02/29/2016 12/17/15   Ottie Glazier, PA-C  ondansetron (ZOFRAN ODT) 4 MG disintegrating tablet Take 1 tablet (4 mg total) by mouth every 4 (four) hours as needed for nausea or vomiting. 02/29/16   Charlesetta Shanks, MD  ondansetron (ZOFRAN) 4 MG tablet Take 1 tablet (4 mg total) by mouth every 6 (six) hours. Patient not taking: Reported on 02/29/2016 12/17/15   Ottie Glazier, PA-C  oxyCODONE-acetaminophen (PERCOCET/ROXICET) 5-325 MG per tablet Take 1 tablet by mouth every 4 (four) hours as needed for pain. Patient not taking: Reported on 12/17/2015 05/23/13   Virgel Manifold, MD  polyethylene glycol powder (GLYCOLAX/MIRALAX) powder Take 17 g by mouth daily. Patient not taking: Reported on 12/17/2015 06/12/13   Hazel Sams, PA-C    Family History History reviewed. No pertinent family history.  Social History Social History  Substance Use Topics  . Smoking status: Never Smoker  . Smokeless tobacco: Never Used  . Alcohol use No     Allergies   Review of patient's allergies indicates no known allergies.  Review of Systems Review of Systems  Gastrointestinal: Positive for abdominal pain (cramping).  Genitourinary: Positive for vaginal bleeding.  All other systems reviewed and are negative.    Physical Exam Updated Vital Signs BP 110/72   Pulse 82   Temp 98.4 F (36.9 C) (Oral)   Resp 17   Ht 5\' 3"  (1.6 m)   Wt 54 kg   SpO2 100%   BMI 21.08 kg/m   Physical Exam  Constitutional: She is oriented to person, place, and time. She appears well-developed and well-nourished.  HENT:  Head: Normocephalic and atraumatic.  Mouth/Throat: Oropharynx is clear and moist.  Eyes: Conjunctivae and EOM are normal. Pupils are equal, round, and reactive to light.    Neck: Normal range of motion.  Cardiovascular: Normal rate, regular rhythm and normal heart sounds.   Pulmonary/Chest: Effort normal and breath sounds normal. No respiratory distress. She has no wheezes.  Abdominal: Soft. Bowel sounds are normal. She exhibits no mass. There is no tenderness.  Abdomen soft, benign, no peritonitis  Genitourinary:  Genitourinary Comments: Clitoral piercing present; no rashes or lesions; large amount of blood pooled in the vaginal vault with clots intermixed; cervical os appears partially open; no appreciable discharge; no adnexal tenderness or masses palpated  Musculoskeletal: Normal range of motion.  Neurological: She is alert and oriented to person, place, and time.  Skin: Skin is warm and dry.  Psychiatric: She has a normal mood and affect.  Nursing note and vitals reviewed.    ED Treatments / Results  Labs (all labs ordered are listed, but only abnormal results are displayed) Labs Reviewed  COMPREHENSIVE METABOLIC PANEL - Abnormal; Notable for the following:       Result Value   BUN 5 (*)    ALT 12 (*)    Total Bilirubin 1.5 (*)    All other components within normal limits  CBC - Abnormal; Notable for the following:    RBC 5.12 (*)    Hemoglobin 11.5 (*)    HCT 35.1 (*)    MCV 68.6 (*)    MCH 22.5 (*)    RDW 17.0 (*)    Platelets 442 (*)    All other components within normal limits  I-STAT BETA HCG BLOOD, ED ([facility], [facility], [facility] ONLY) - Abnormal; Notable for the following:    I-stat hCG, quantitative >2,000.0 (*)    All other components within normal limits  WET PREP, GENITAL  LIPASE, BLOOD  URINALYSIS, ROUTINE W REFLEX MICROSCOPIC (NOT AT [facility])  HCG, QUANTITATIVE, PREGNANCY  TYPE AND SCREEN  ABO/RH  GC/CHLAMYDIA PROBE AMP ([place]) NOT AT [facility]    EKG  EKG Interpretation None       Radiology US Ob Comp Less 14 Wks  Result Date: 10/06/2016 CLINICAL DATA:  Vaginal bleeding. EXAM: OBSTETRIC <14 WK [place] AND TRANSVAGINAL OB US  TECHNIQUE: Both transabdominal and transvaginal ultrasound examinations were performed for complete evaluation of the gestation as well as the maternal uterus, adnexal regions, and pelvic cul-de-sac. Transvaginal technique was performed to assess early pregnancy. COMPARISON:  None. FINDINGS: Intrauterine gestational sac: None Yolk sac:  None Embryo:  None Cardiac Activity: None Subchorionic hemorrhage:  None visualized. Maternal uterus/adnexae: 2.7 cm anechoic left ovarian mass most consistent with a follicle. Ovaries otherwise normal. Endometrium is thickened measuring 23 mm. Small-moderate pelvic free fluid. IMPRESSION: 1. No intrauterine gestational sac is identified. Given the elevated beta HCG, differential diagnosis includes pregnancy too early to detect versus ectopic pregnancy versus missed abortion. Recommend  clinical correlation, serial quantitative beta HCGs, ectopic precautions, and followup ultrasound as clinically indicated. Electronically Signed   By: Kathreen Devoid   On: 10/06/2016 18:34   US Ob Transvaginal  Result Date: 10/06/2016 CLINICAL DATA:  Vaginal bleeding. EXAM: OBSTETRIC <14 WK [place] AND TRANSVAGINAL OB US TECHNIQUE: Both transabdominal and transvaginal ultrasound examinations were performed for complete evaluation of the gestation as well as the maternal uterus, adnexal regions, and pelvic cul-de-sac. Transvaginal technique was performed to assess early pregnancy. COMPARISON:  None. FINDINGS: Intrauterine gestational sac: None Yolk sac:  None Embryo:  None Cardiac Activity: None Subchorionic hemorrhage:  None visualized. Maternal uterus/adnexae: 2.7 cm anechoic left ovarian mass most consistent with a follicle. Ovaries otherwise normal. Endometrium is thickened measuring 23 mm. Small-moderate pelvic free fluid. IMPRESSION: 1. No intrauterine gestational sac is identified. Given the elevated beta HCG, differential diagnosis includes pregnancy too early to detect versus ectopic pregnancy  versus missed abortion. Recommend clinical correlation, serial quantitative beta HCGs, ectopic precautions, and followup ultrasound as clinically indicated. Electronically Signed   By: Kathreen Devoid   On: 10/06/2016 18:34    Procedures Procedures (including critical care time)  Medications Ordered in ED Medications - No data to display   Initial Impression / Assessment and Plan / ED Course  I have reviewed the triage vital signs and the nursing notes.  Pertinent labs & imaging results that were available during my care of the patient were reviewed by me and considered in my medical decision making (see chart for details).  Clinical Course   20 y.o. F here with vaginal bleeding.  G1P0, approx [redacted] weeks gestation.  Took "abortion pill" at end of last week.  She reports heavy vaginal bleeding with clots beginning this morning. She is hemodynamically stable here. Her lab work is reassuring, H/H stable.  O+ blood type, not a candidate for Rhogam.  On pelvic exam, she has a large amount of vaginal bleeding with clots noted.  Her cervical os appears partially opened.  No gestational sac seen on ultrasound today.  hcg correlating around 6-[redacted] weeks gestation by value.  Patient is likely actively [place].  She remains hemodynamically stable here.  Her abdomen is soft, not peritoneal.  Have recommended that she follow-up closely with her OB/GYN later this week for recheck and repeat blood work. States she has an appointment scheduled already. Given bleeding precautions, encouraged pelvic rest.  Discussed plan with patient, she acknowledged understanding and agreed with plan of care.  Return precautions given for new or worsening symptoms.  Final Clinical Impressions(s) / ED Diagnoses   Final diagnoses:  Vaginal bleeding    New Prescriptions New Prescriptions   No medications on file     Kathryne Hitch 10/07/16 0036    Drenda Freeze, MD 10/07/16 1910

## 2016-10-06 NOTE — ED Notes (Signed)
Verified that pt is still here.  Updated that they are cleaning a room for her.

## 2016-10-06 NOTE — ED Triage Notes (Signed)
Pt reports abd pain that started two days ago as well as back pain. She reports she is [redacted] weeks pregnant and began having vaginal bleeding yesterday.

## 2016-10-06 NOTE — ED Notes (Signed)
No answer in waiting room for reassessment  

## 2016-10-06 NOTE — ED Notes (Signed)
Pt's significant other approached me in the hall. Stated that while he was out of town, pt went to an abortion clinic and was "given a pill since she was <9wks." Notified PA.

## 2016-10-07 LAB — GC/CHLAMYDIA PROBE AMP (~~LOC~~) NOT AT ARMC
CHLAMYDIA, DNA PROBE: NEGATIVE
NEISSERIA GONORRHEA: NEGATIVE

## 2016-10-07 LAB — ABO/RH: ABO/RH(D): O POS

## 2016-10-07 NOTE — ED Notes (Signed)
Pt departed in NAD.  

## 2016-10-07 NOTE — Discharge Instructions (Signed)
May continue taking tylenol for pain. You may continue to have bleeding for the next few days. Follow-up your OB-GYN for re-check.  You will likely need some repeat blood work.  Recommend to follow-up at [facility] hospital if you need repeat emergent evaluation.

## 2017-05-05 DIAGNOSIS — Z01411 Encounter for gynecological examination (general) (routine) with abnormal findings: Secondary | ICD-10-CM | POA: Diagnosis not present

## 2017-05-05 DIAGNOSIS — Z01419 Encounter for gynecological examination (general) (routine) without abnormal findings: Secondary | ICD-10-CM | POA: Diagnosis not present

## 2017-05-05 DIAGNOSIS — Z6821 Body mass index (BMI) 21.0-21.9, adult: Secondary | ICD-10-CM | POA: Diagnosis not present

## 2017-09-14 ENCOUNTER — Encounter (HOSPITAL_COMMUNITY): Payer: Self-pay

## 2017-09-14 ENCOUNTER — Emergency Department (HOSPITAL_COMMUNITY)
Admission: EM | Admit: 2017-09-14 | Discharge: 2017-09-15 | Disposition: A | Discharge status: Home / Self Care | Payer: Commercial Managed Care - HMO | Attending: Emergency Medicine | Admitting: Emergency Medicine

## 2017-09-14 DIAGNOSIS — N12 Tubulo-interstitial nephritis, not specified as acute or chronic: Secondary | ICD-10-CM

## 2017-09-14 LAB — URINALYSIS, ROUTINE W REFLEX MICROSCOPIC
Bilirubin Urine: NEGATIVE
GLUCOSE, UA: NEGATIVE mg/dL
KETONES UR: NEGATIVE mg/dL
Nitrite: POSITIVE — AB
PH: 7 (ref 5.0–8.0)
Protein, ur: NEGATIVE mg/dL
SPECIFIC GRAVITY, URINE: 1.014 (ref 1.005–1.030)

## 2017-09-14 LAB — COMPREHENSIVE METABOLIC PANEL
ALT: 15 U/L (ref 14–54)
AST: 24 U/L (ref 15–41)
Albumin: 4 g/dL (ref 3.5–5.0)
Alkaline Phosphatase: 56 U/L (ref 38–126)
Anion gap: 10 (ref 5–15)
BILIRUBIN TOTAL: 0.9 mg/dL (ref 0.3–1.2)
BUN: 8 mg/dL (ref 6–20)
CALCIUM: 9.4 mg/dL (ref 8.9–10.3)
CHLORIDE: 107 mmol/L (ref 101–111)
CO2: 19 mmol/L — ABNORMAL LOW (ref 22–32)
CREATININE: 0.73 mg/dL (ref 0.44–1.00)
Glucose, Bld: 109 mg/dL — ABNORMAL HIGH (ref 65–99)
POTASSIUM: 3.6 mmol/L (ref 3.5–5.1)
Sodium: 136 mmol/L (ref 135–145)
Total Protein: 6.8 g/dL (ref 6.5–8.1)

## 2017-09-14 LAB — CBC
HCT: 30.4 % — ABNORMAL LOW (ref 36.0–46.0)
Hemoglobin: 9.6 g/dL — ABNORMAL LOW (ref 12.0–15.0)
MCH: 19.9 pg — AB (ref 26.0–34.0)
MCHC: 31.6 g/dL (ref 30.0–36.0)
MCV: 62.9 fL — AB (ref 78.0–100.0)
PLATELETS: 503 10*3/uL — AB (ref 150–400)
RBC: 4.83 MIL/uL (ref 3.87–5.11)
RDW: 17.5 % — AB (ref 11.5–15.5)
WBC: 5.4 10*3/uL (ref 4.0–10.5)

## 2017-09-14 LAB — I-STAT BETA HCG BLOOD, ED (MC, WL, AP ONLY): I-stat hCG, quantitative: <5 m[IU]/mL (ref <5)

## 2017-09-14 LAB — LIPASE, BLOOD: LIPASE: 34 U/L (ref 11–51)

## 2017-09-14 MED ORDER — ONDANSETRON HCL 4 MG/2ML IJ SOLN
4.0000 mg | Freq: Once | INTRAMUSCULAR | Status: AC
  Administered 2017-09-14: 4 mg via INTRAVENOUS
  Filled 2017-09-14: qty 2

## 2017-09-14 MED ORDER — FENTANYL CITRATE (PF) 100 MCG/2ML IJ SOLN
50.0000 ug | INTRAMUSCULAR | Status: DC | PRN
Start: 2017-09-14 — End: 2017-09-15
  Administered 2017-09-14: 50 ug via NASAL

## 2017-09-14 MED ORDER — FENTANYL CITRATE (PF) 100 MCG/2ML IJ SOLN
INTRAMUSCULAR | Status: AC
  Filled 2017-09-14: qty 2

## 2017-09-14 MED ORDER — FENTANYL CITRATE (PF) 100 MCG/2ML IJ SOLN
50.0000 ug | Freq: Once | INTRAMUSCULAR | Status: AC
  Administered 2017-09-14: 50 ug via INTRAVENOUS
  Filled 2017-09-14: qty 2

## 2017-09-14 NOTE — ED Provider Notes (Signed)
[place] DEPT Provider Note   CSN: [id number] Arrival date & time: 09/14/17  2247     History   Chief Complaint Chief Complaint  Patient presents with  . Abdominal Pain    HPI Patty Garcia is a 21 y.o. female.  The history is provided by the patient.  Abdominal Pain   This is a new problem. The current episode started 3 to 5 hours ago. The problem occurs constantly. The problem has been rapidly worsening. Pain location: right flank. The quality of the pain is sharp. The pain is severe. Associated symptoms include nausea. Pertinent negatives include fever. Nothing aggravates the symptoms. Nothing relieves the symptoms.  pt reports abrupt onset of right flank pain that moves into abdomen She reports nausea She reports she is very uncomfortable   Past Medical History:  Diagnosis Date  . Blood transfusion without reported diagnosis   . Sickle cell trait ([place])     There are no active problems to display for this patient.   Past Surgical History:  Procedure Laterality Date  . HERNIA REPAIR    . R ovanian dermoid cyst removed  06/10/13    OB History    Gravida Para Term Preterm AB Living   1             SAB TAB Ectopic Multiple Live Births                   Home Medications    Prior to Admission medications   Medication Sig Start Date End Date Taking? Authorizing Provider  etonogestrel-ethinyl estradiol (NUVARING) 0.12-0.015 MG/24HR vaginal ring Place 1 each vaginally every 28 (twenty-eight) days. 04/19/15   [provider]    Family History History reviewed. No pertinent family history.  Social History Social History  Substance Use Topics  . Smoking status: Never Smoker  . Smokeless tobacco: Never Used  . Alcohol use No     Allergies   Patient has no known allergies.   Review of Systems Review of Systems  Constitutional: Negative for fever.  Gastrointestinal: Positive for abdominal pain and nausea.  Genitourinary: Positive for  flank pain.  All other systems reviewed and are negative.    Physical Exam Updated Vital Signs BP 108/68   Pulse 92   Temp 98.1 F (36.7 C) (Oral)   Resp 18   Ht 1.6 m (5\' 3" )   Wt 54.4 kg (120 lb)   LMP 08/31/2017   SpO2 99%   Breastfeeding? No   BMI 21.26 kg/m   Physical Exam CONSTITUTIONAL: Well developed/well nourished, anxious, hyperventilating HEAD: Normocephalic/atraumatic EYES: EOMI/PERRL ENMT: Mucous membranes moist NECK: supple no meningeal signs SPINE/BACK:entire spine nontender CV: S1/S2 noted, no murmurs/rubs/gallops noted LUNGS: Lungs are clear to auscultation bilaterally, no apparent distress ABDOMEN: soft, nontender, no rebound or guarding, bowel sounds noted throughout abdomen CZ:YSAYT cva tenderness NEURO: Pt is awake/alert/appropriate, moves all extremitiesx4.  No facial droop.   EXTREMITIES: pulses normal/equal, full ROM SKIN: warm, color normal PSYCH: anxious  ED Treatments / Results  Labs (all labs ordered are listed, but only abnormal results are displayed) Labs Reviewed  COMPREHENSIVE METABOLIC PANEL - Abnormal; Notable for the following:       Result Value   CO2 19 (*)    Glucose, Bld 109 (*)    All other components within normal limits  CBC - Abnormal; Notable for the following:    Hemoglobin 9.6 (*)    HCT 30.4 (*)    MCV 62.9 (*)  MCH 19.9 (*)    RDW 17.5 (*)    Platelets 503 (*)    All other components within normal limits  URINALYSIS, ROUTINE W REFLEX MICROSCOPIC - Abnormal; Notable for the following:    APPearance HAZY (*)    Hgb urine dipstick SMALL (*)    Nitrite POSITIVE (*)    Leukocytes, UA TRACE (*)    Bacteria, UA MANY (*)    Squamous Epithelial / LPF 0-5 (*)    All other components within normal limits  LIPASE, BLOOD  I-STAT BETA HCG BLOOD, ED ([facility], [facility], [facility] ONLY)    EKG  EKG Interpretation None       Radiology No results found.  Procedures Procedures    Medications Ordered in ED Medications    fentaNYL (SUBLIMAZE) injection 50 mcg (50 mcg Nasal Given 09/14/17 2302)  fentaNYL (SUBLIMAZE) 100 MCG/2ML injection (not administered)  fentaNYL (SUBLIMAZE) injection 50 mcg (50 mcg Intravenous Given 09/14/17 2333)  ondansetron (ZOFRAN) injection 4 mg (4 mg Intravenous Given 09/14/17 2333)  sodium chloride 0.9 % bolus 1,000 mL (0 mLs Intravenous Stopped 09/15/17 0235)  cefTRIAXone (ROCEPHIN) 1 g in dextrose 5 % 50 mL IVPB (0 g Intravenous Stopped 09/15/17 0113)  ibuprofen (ADVIL,MOTRIN) tablet 600 mg (600 mg Oral Given 09/15/17 0043)  HYDROcodone-acetaminophen (NORCO/VICODIN) 5-325 MG per tablet 1 tablet (1 tablet Oral Given 09/15/17 0043)     Initial Impression / Assessment and Plan / ED Course  I have reviewed the triage vital signs and the nursing notes.  Pertinent labs  results that were available during my care of the patient were reviewed by me and considered in my medical decision making (see chart for details).     12:38 AM Pt reports abrupt onset of flank pain/abd pain She is now more comfortable appearing abd soft/nontender Will treat for PYELO given u/a results 2:43 AM Pt improved No vomiting Not septic appearing Sleeping on repeat assessment Will d/c home   Final Clinical Impressions(s) / ED Diagnoses   Final diagnoses:  Pyelonephritis    New Prescriptions New Prescriptions   CEPHALEXIN (KEFLEX) 500 MG CAPSULE    Take 1 capsule (500 mg total) by mouth 3 (three) times daily.     Ripley Fraise, MD 09/15/17 [phone]

## 2017-09-14 NOTE — ED Triage Notes (Signed)
Onset 3 hours PTA RLQ abd pain and right flank pain.  No urinary symptoms.

## 2017-09-15 MED ORDER — SODIUM CHLORIDE 0.9 % IV BOLUS (SEPSIS)
1000.0000 mL | Freq: Once | INTRAVENOUS | Status: AC
  Administered 2017-09-15: 1000 mL via INTRAVENOUS

## 2017-09-15 MED ORDER — IBUPROFEN 400 MG PO TABS
600.0000 mg | ORAL_TABLET | Freq: Once | ORAL | Status: AC
  Administered 2017-09-15: 600 mg via ORAL
  Filled 2017-09-15: qty 1

## 2017-09-15 MED ORDER — HYDROCODONE-ACETAMINOPHEN 5-325 MG PO TABS
1.0000 | ORAL_TABLET | Freq: Once | ORAL | Status: AC
  Administered 2017-09-15: 1 via ORAL
  Filled 2017-09-15: qty 1

## 2017-09-15 MED ORDER — DEXTROSE 5 % IV SOLN
1.0000 g | Freq: Once | INTRAVENOUS | Status: AC
  Administered 2017-09-15: 1 g via INTRAVENOUS
  Filled 2017-09-15: qty 10

## 2017-09-15 MED ORDER — CEPHALEXIN 500 MG PO CAPS: 500.0000 mg | ORAL_CAPSULE | Freq: Three times a day (TID) | ORAL | 0 refills | Status: DC

## 2017-09-15 NOTE — ED Notes (Signed)
Pt understood dc material. NAD noted. Scripts and work excuse given

## 2018-07-07 IMAGING — US US OB TRANSVAGINAL
1 series · 14 of 28 positions shown · non-contrast
Comparison: None.

CLINICAL DATA: Vaginal bleeding.

EXAM:
OBSTETRIC <14 WK US AND TRANSVAGINAL OB US
TECHNIQUE: Both transabdominal and transvaginal ultrasound examinations were
performed for complete evaluation of the gestation as well as the
maternal uterus, adnexal regions, and pelvic cul-de-sac.
Transvaginal technique was performed to assess early pregnancy.

[Series 1: us ob transvaginal · 0.23mm/px · 14 of 54 slices shown]
[im 2/54]
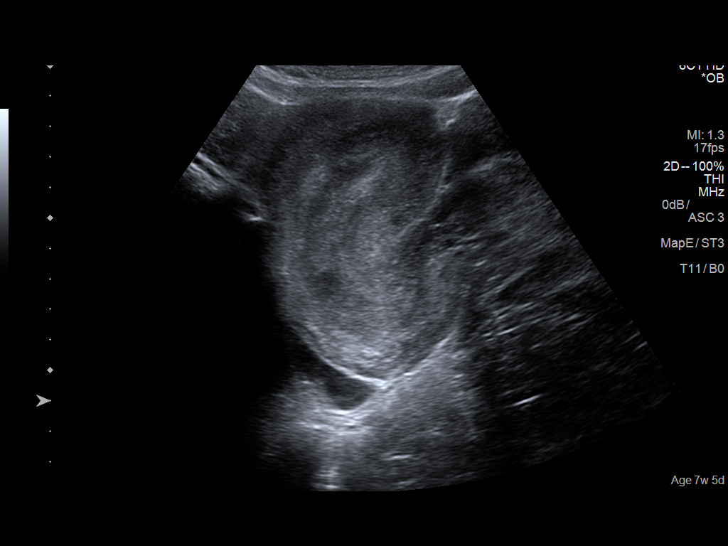
[im 6/54]
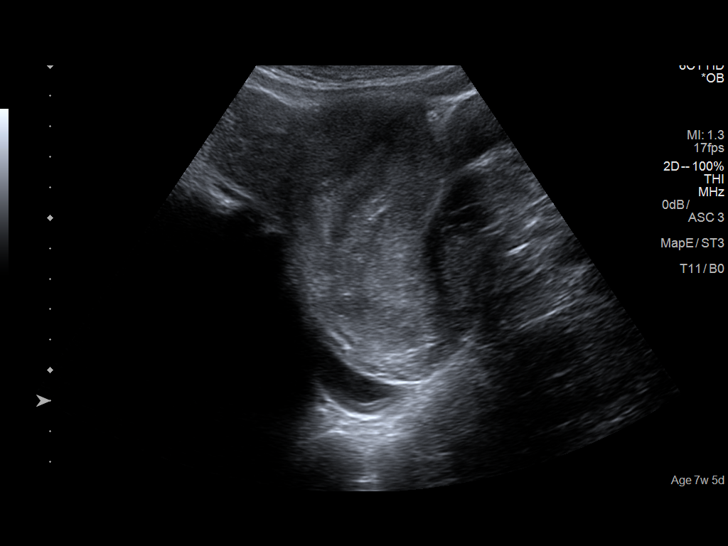
[im 10/54]
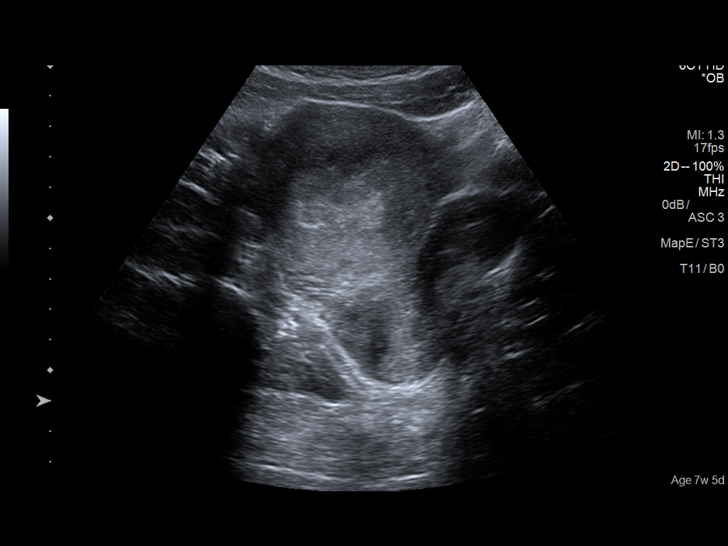
[im 14/54]
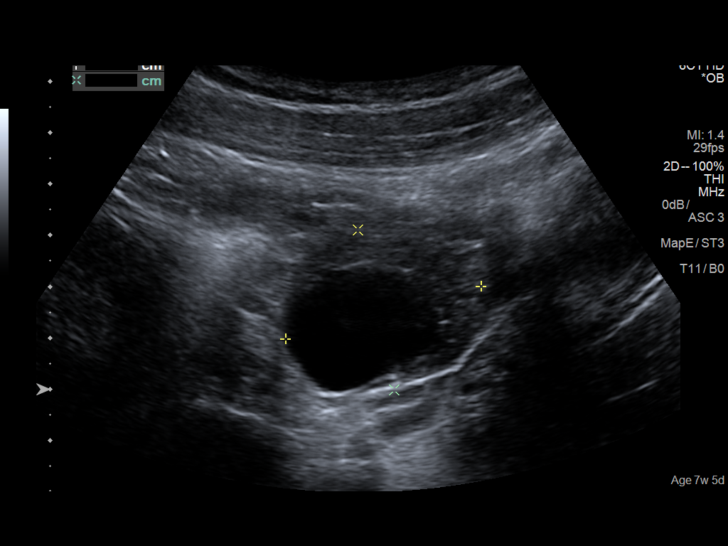
[im 18/54]
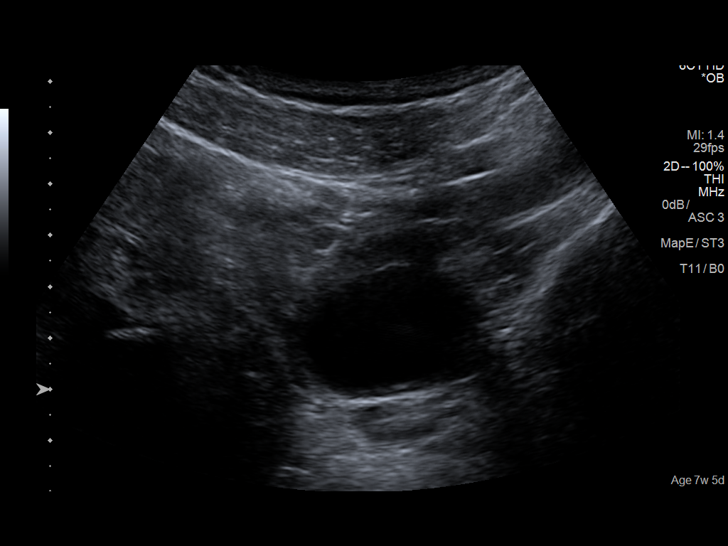
[im 22/54]
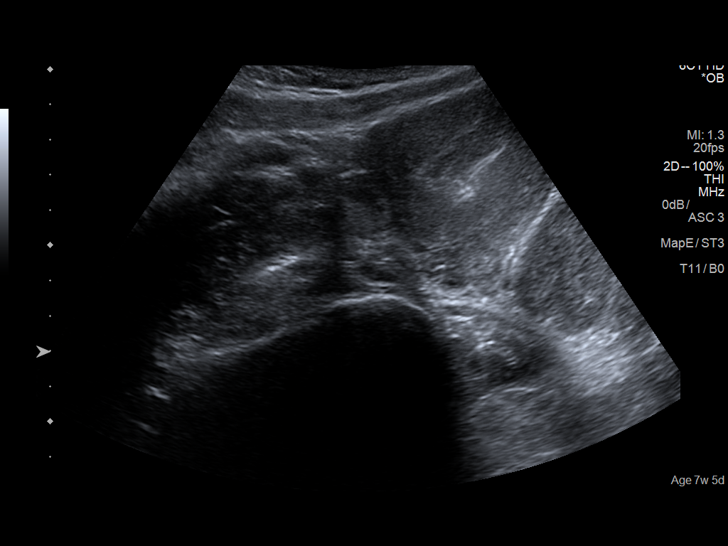
[im 26/54]
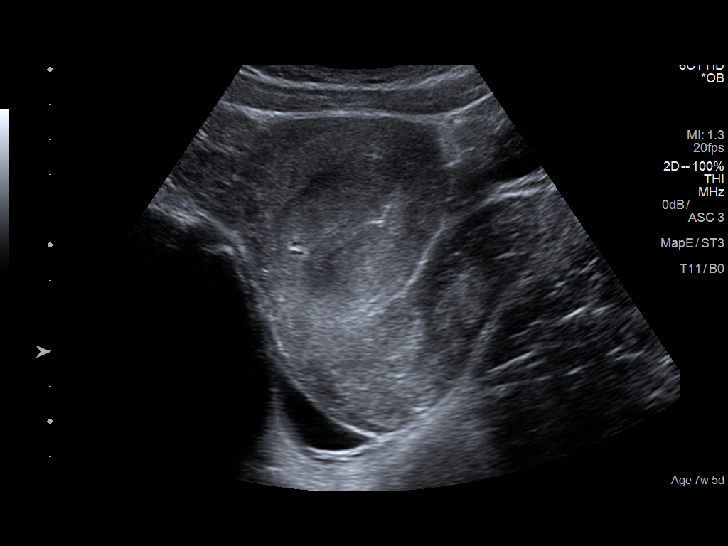
[im 30/54]
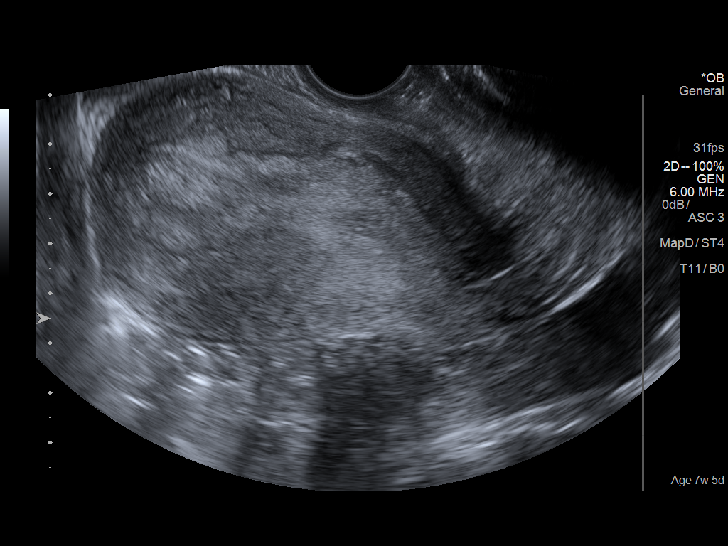
[im 34/54]
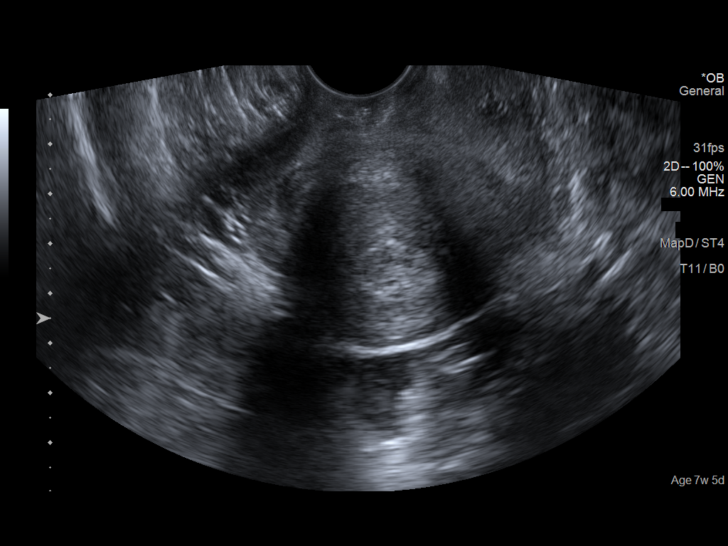
[im 38/54]
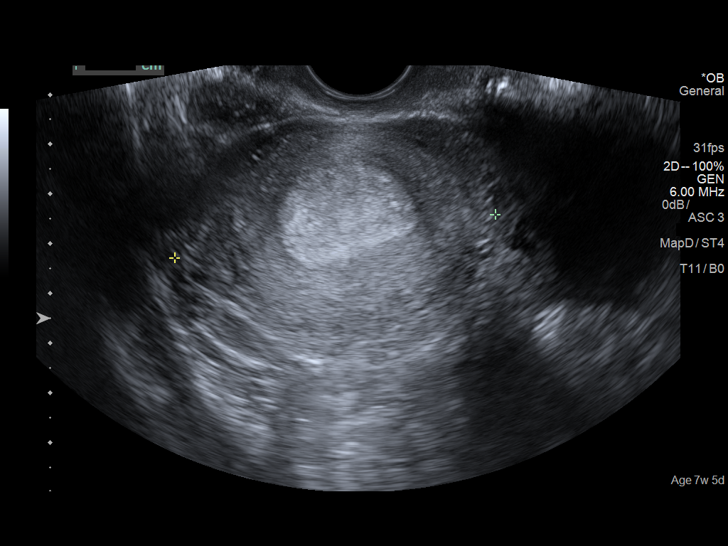
[im 42/54]
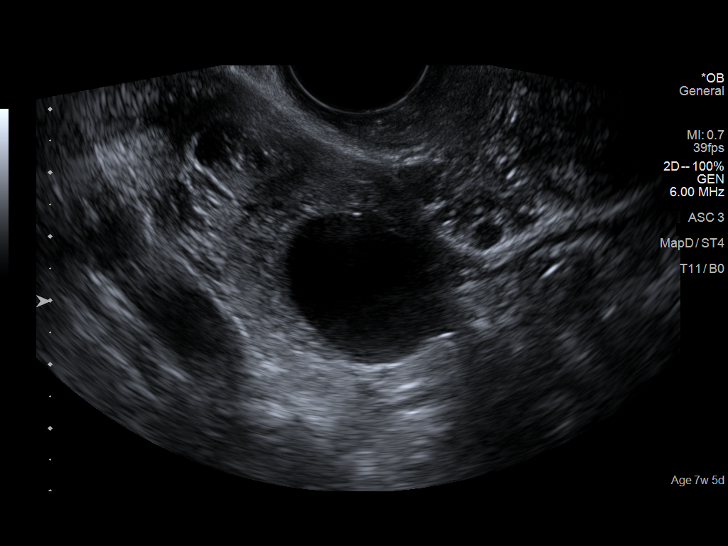
[im 46/54]
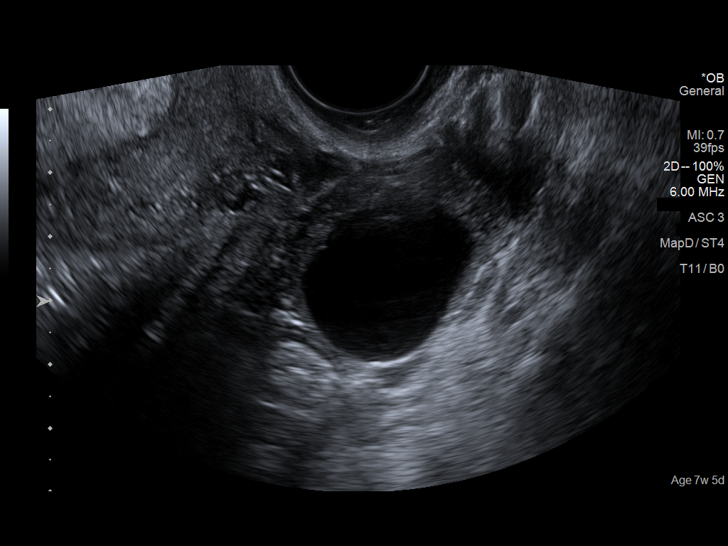
[im 50/54]
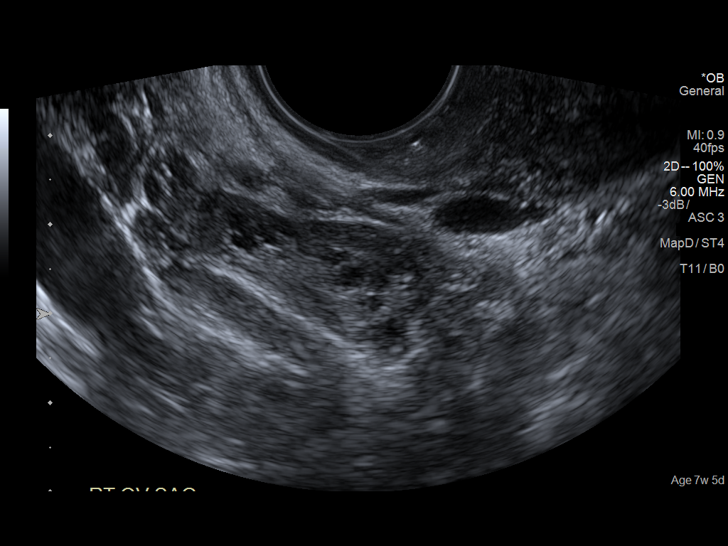
[im 54/54]
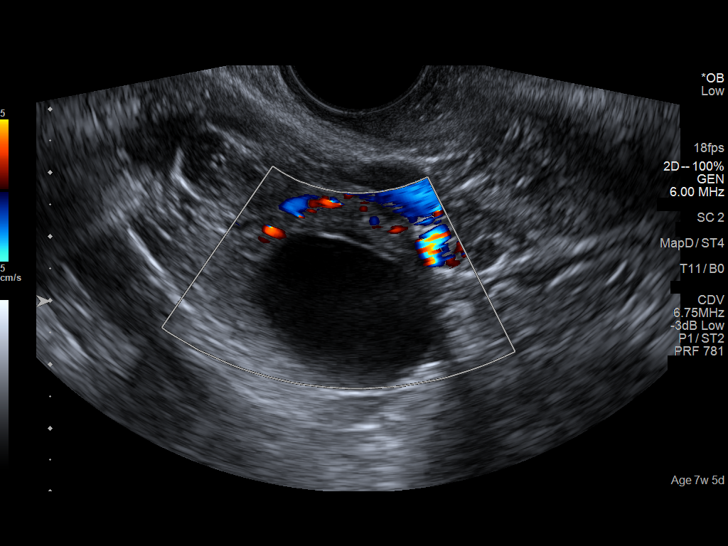

[14 of 28 positions shown; findings below may reference images not displayed]

FINDINGS: Intrauterine gestational sac: None

Yolk sac:  None

Embryo:  None

Cardiac Activity: None

Subchorionic hemorrhage:  None visualized.

Maternal uterus/adnexae: 2.7 cm anechoic left ovarian mass most
consistent with a follicle. Ovaries otherwise normal. Endometrium is
thickened measuring 23 mm. Small-moderate pelvic free fluid.
IMPRESSION: 1. No intrauterine gestational sac is identified. Given the elevated
beta HCG, differential diagnosis includes pregnancy too early to
detect versus ectopic pregnancy versus missed abortion. Recommend
clinical correlation, serial quantitative beta HCGs, ectopic
precautions, and followup ultrasound as clinically indicated.

## 2018-08-10 ENCOUNTER — Emergency Department (HOSPITAL_COMMUNITY)
Admission: EM | Admit: 2018-08-10 | Discharge: 2018-08-11 | Disposition: A | Discharge status: Other Acute Inpatient Hospital | Payer: 59 | Source: Home / Self Care | Attending: Emergency Medicine | Admitting: Emergency Medicine

## 2018-08-10 ENCOUNTER — Other Ambulatory Visit: Payer: Self-pay

## 2018-08-10 DIAGNOSIS — Z79899 Other long term (current) drug therapy: Secondary | ICD-10-CM

## 2018-08-10 DIAGNOSIS — R45851 Suicidal ideations: Secondary | ICD-10-CM | POA: Insufficient documentation

## 2018-08-10 DIAGNOSIS — F332 Major depressive disorder, recurrent severe without psychotic features: Secondary | ICD-10-CM

## 2018-08-10 LAB — COMPREHENSIVE METABOLIC PANEL
ALT: 12 U/L (ref 0–44)
AST: 19 U/L (ref 15–41)
Albumin: 4.4 g/dL (ref 3.5–5.0)
Alkaline Phosphatase: 52 U/L (ref 38–126)
Anion gap: 9 (ref 5–15)
BILIRUBIN TOTAL: 1.1 mg/dL (ref 0.3–1.2)
BUN: 7 mg/dL (ref 6–20)
CO2: 24 mmol/L (ref 22–32)
Calcium: 9.5 mg/dL (ref 8.9–10.3)
Chloride: 108 mmol/L (ref 98–111)
Creatinine, Ser: 0.71 mg/dL (ref 0.44–1.00)
GFR calc Af Amer: >60 mL/min (ref >60)
Glucose, Bld: 93 mg/dL (ref 70–99)
Potassium: 4 mmol/L (ref 3.5–5.1)
Sodium: 141 mmol/L (ref 135–145)
TOTAL PROTEIN: 7.8 g/dL (ref 6.5–8.1)

## 2018-08-10 LAB — CBC
HEMATOCRIT: 30.1 % — AB (ref 36.0–46.0)
Hemoglobin: 9.2 g/dL — ABNORMAL LOW (ref 12.0–15.0)
MCH: 17.9 pg — ABNORMAL LOW (ref 26.0–34.0)
MCHC: 30.6 g/dL (ref 30.0–36.0)
MCV: 58.6 fL — AB (ref 78.0–100.0)
PLATELETS: 549 10*3/uL — AB (ref 150–400)
RBC: 5.14 MIL/uL — AB (ref 3.87–5.11)
RDW: 18 % — ABNORMAL HIGH (ref 11.5–15.5)
WBC: 5.2 10*3/uL (ref 4.0–10.5)

## 2018-08-10 LAB — RAPID URINE DRUG SCREEN, HOSP PERFORMED
AMPHETAMINES: NOT DETECTED
BARBITURATES: NOT DETECTED
Benzodiazepines: NOT DETECTED
Cocaine: NOT DETECTED
Tetrahydrocannabinol: NOT DETECTED

## 2018-08-10 LAB — SALICYLATE LEVEL: Salicylate Lvl: <7 mg/dL (ref 2.8–30.0)

## 2018-08-10 LAB — ACETAMINOPHEN LEVEL: Acetaminophen (Tylenol), Serum: <10 ug/mL — ABNORMAL LOW (ref 10–30)

## 2018-08-10 LAB — ETHANOL

## 2018-08-10 LAB — PREGNANCY, URINE: Preg Test, Ur: NEGATIVE
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
## 2018-08-10 NOTE — ED Notes (Signed)
Bed: [facility] Expected date:  Expected time:  Means of arrival:  Comments: Hold for Tra[facility] 4

## 2018-08-10 NOTE — ED Provider Notes (Signed)
[facility] DEPT Provider Note   CSN: [id number] Arrival date & time: 08/10/18  1930     History   Chief Complaint Chief Complaint  Patient presents with  . Suicidal    HPI Patty Garcia is a 22 y.o. female.  HPI Patient presents to the emergency department with suicidal thoughts that have been ongoing for the last few months but got worse over the last week.  Patient states that she has had increasing depression over the last several months as well.  She states she is not taking any medications at this time.  The patient denies chest pain, shortness of breath, headache,blurred vision, neck pain, fever, cough, weakness, numbness, dizziness, anorexia, edema, abdominal pain, nausea, vomiting, diarrhea, rash, back pain, dysuria, hematemesis, bloody stool, near syncope, or syncope. Past Medical History:  Diagnosis Date  . Blood transfusion without reported diagnosis   . Sickle cell trait ([place])     There are no active problems to display for this patient.   Past Surgical History:  Procedure Laterality Date  . HERNIA REPAIR    . R ovanian dermoid cyst removed  06/10/13     OB History    Gravida  1   Para      Term      Preterm      AB      Living        SAB      TAB      Ectopic      Multiple      Live Births               Home Medications    Prior to Admission medications   Medication Sig Start Date End Date Taking? Authorizing Provider  cephALEXin (KEFLEX) 500 MG capsule Take 1 capsule (500 mg total) by mouth 3 (three) times daily. 09/15/17   Ripley Fraise, MD  etonogestrel-ethinyl estradiol (NUVARING) 0.12-0.015 MG/24HR vaginal ring Place 1 each vaginally every 28 (twenty-eight) days. 04/19/15   [provider]    Family History No family history on file.  Social History Social History   Tobacco Use  . Smoking status: Never Smoker  . Smokeless tobacco: Never Used  Substance Use Topics  .  Alcohol use: No  . Drug use: No     Allergies   Patient has no known allergies.   Review of Systems Review of Systems All other systems negative except as documented in the HPI. All pertinent positives and negatives as reviewed in the HPI.  Physical Exam Updated Vital Signs BP 118/84   Pulse 85   Temp 98.7 F (37.1 C) (Oral)   Resp 12   Ht 5\' 3"  (1.6 m)   Wt 54.4 kg   LMP 07/20/2018   SpO2 97%   BMI 21.26 kg/m   Physical Exam  Constitutional: She is oriented to person, place, and time. She appears well-developed and well-nourished. No distress.  HENT:  Head: Normocephalic and atraumatic.  Mouth/Throat: Oropharynx is clear and moist.  Eyes: Pupils are equal, round, and reactive to light.  Neck: Normal range of motion. Neck supple.  Cardiovascular: Normal rate, regular rhythm and normal heart sounds. Exam reveals no gallop and no friction rub.  No murmur heard. Pulmonary/Chest: Effort normal and breath sounds normal. No respiratory distress. She has no wheezes.  Abdominal: Soft. Bowel sounds are normal. She exhibits no distension. There is no tenderness.  Neurological: She is alert and oriented to person, place, and  time. She exhibits normal muscle tone. Coordination normal.  Skin: Skin is warm and dry. Capillary refill takes less than 2 seconds. No rash noted. No erythema.  Psychiatric: She has a normal mood and affect. Her behavior is normal. She expresses suicidal ideation. She expresses suicidal plans.  Nursing note and vitals reviewed.    ED Treatments / Results  Labs (all labs ordered are listed, but only abnormal results are displayed) Labs Reviewed  CBC - Abnormal; Notable for the following components:      Result Value   RBC 5.14 (*)    Hemoglobin 9.2 (*)    HCT 30.1 (*)    MCV 58.6 (*)    MCH 17.9 (*)    RDW 18.0 (*)    Platelets 549 (*)    All other components within normal limits  RAPID URINE DRUG SCREEN, HOSP PERFORMED - Abnormal; Notable for the  following components:   Opiates   (*)    Value: Result not available. Reagent lot number recalled by manufacturer.   All other components within normal limits  COMPREHENSIVE METABOLIC PANEL  PREGNANCY, URINE  ETHANOL  SALICYLATE LEVEL  ACETAMINOPHEN LEVEL    EKG None  Radiology No results found.  Procedures Procedures (including critical care time)  Medications Ordered in ED Medications - No data to display   Initial Impression / Assessment and Plan / ED Course  I have reviewed the triage vital signs and the nursing notes.  Pertinent labs & imaging results that were available during my care of the patient were reviewed by me and considered in my medical decision making (see chart for details).     Patient on a TTS assessment for suicidal ideations and her depression.  Final Clinical Impressions(s) / ED Diagnoses   Final diagnoses:  None    ED Discharge Orders    None       Rebeca Allegra 08/12/18 0129    Milton Ferguson, MD 08/12/18 1529

## 2018-08-10 NOTE — ED Triage Notes (Signed)
Pt says that she has just "been feeling down for a while" and having thoughts of harming herself. Pt says she was thinking of taking "all the medicines in my house."

## 2018-08-10 NOTE — ED Notes (Signed)
Patient currently denies SI/HI/AVH but admits earlier today she suicidal thoughts with a plan to over dose on her medications. Plan of care discussed. Encouragement and support provided and safety maintain. Q 15 min safety checks in place and video monitoring.

## 2018-08-10 NOTE — ED Notes (Signed)
ED Provider at bedside. 

## 2018-08-10 NOTE — ED Notes (Signed)
Bed: [facility] Expected date:  Expected time:  Means of arrival:  Comments: 

## 2018-08-11 ENCOUNTER — Inpatient Hospital Stay (HOSPITAL_COMMUNITY)
Admission: AD | Admit: 2018-08-11 | Discharge: 2018-08-15 | DRG: 885 | Disposition: A | Discharge status: Home / Self Care | Payer: 59 | Source: Intra-hospital | Attending: Psychiatry | Admitting: Psychiatry

## 2018-08-11 ENCOUNTER — Encounter (HOSPITAL_COMMUNITY): Payer: Self-pay

## 2018-08-11 DIAGNOSIS — F332 Major depressive disorder, recurrent severe without psychotic features: Secondary | ICD-10-CM | POA: Diagnosis present

## 2018-08-11 DIAGNOSIS — Z915 Personal history of self-harm: Secondary | ICD-10-CM | POA: Diagnosis not present

## 2018-08-11 DIAGNOSIS — G4700 Insomnia, unspecified: Secondary | ICD-10-CM | POA: Diagnosis present

## 2018-08-11 DIAGNOSIS — D573 Sickle-cell trait: Secondary | ICD-10-CM | POA: Diagnosis present

## 2018-08-11 DIAGNOSIS — D509 Iron deficiency anemia, unspecified: Secondary | ICD-10-CM | POA: Diagnosis present

## 2018-08-11 DIAGNOSIS — Z91018 Allergy to other foods: Secondary | ICD-10-CM

## 2018-08-11 MED ORDER — TRAZODONE HCL 50 MG PO TABS
50.0000 mg | ORAL_TABLET | Freq: Every evening | ORAL | Status: DC | PRN
  Administered 2018-08-11 – 2018-08-12 (×2): 50 mg via ORAL
  Filled 2018-08-11 (×2): qty 1

## 2018-08-11 MED ORDER — MAGNESIUM HYDROXIDE 400 MG/5ML PO SUSP: 30.0000 mL | Freq: Every day | ORAL | Status: DC | PRN

## 2018-08-11 MED ORDER — HYDROXYZINE HCL 25 MG PO TABS
25.0000 mg | ORAL_TABLET | Freq: Three times a day (TID) | ORAL | Status: DC | PRN
  Administered 2018-08-12: 25 mg via ORAL
  Filled 2018-08-11 (×2): qty 1

## 2018-08-11 MED ORDER — VENLAFAXINE HCL ER 37.5 MG PO CP24
37.5000 mg | ORAL_CAPSULE | Freq: Every day | ORAL | Status: DC
  Administered 2018-08-12: 37.5 mg via ORAL
  Filled 2018-08-11 (×3): qty 1

## 2018-08-11 MED ORDER — ACETAMINOPHEN 325 MG PO TABS
650.0000 mg | ORAL_TABLET | Freq: Four times a day (QID) | ORAL | Status: DC | PRN
  Administered 2018-08-12: 650 mg via ORAL
  Filled 2018-08-11: qty 2

## 2018-08-11 MED ORDER — ALUM & MAG HYDROXIDE-SIMETH 200-200-20 MG/5ML PO SUSP: 30.0000 mL | ORAL | Status: DC | PRN

## 2018-08-11 NOTE — BH Assessment (Signed)
[facility] Assessment Progress Note  Per Lindon Romp, FNP, this pt requires psychiatric hospitalization at this time.  Per morning shift report, pt has assigned pt to [facility] Rm [id number]; [facility] will be ready to receive pt after 09:00.  Pt has reportedly signed Voluntary Admission and Consent for Treatment.  Pt's nurse, Diane, has been notified, and agrees to send original paperwork along with pt via [facility], and to call report to [phone].  Jalene Mullet, [facility] Coordinator [phone]

## 2018-08-11 NOTE — H&P (Signed)
Psychiatric Admission Assessment Adult  Patient Identification: Patty Garcia MRN:  [id number] Date of Evaluation:  08/11/2018 Chief Complaint:   Depression Principal Diagnosis: MDD, no psychotic features  Diagnosis:   Patient Active Problem List   Diagnosis Date Noted  . Severe recurrent major depression without psychotic features ([place]) [F33.2] 08/11/2018   History of Present Illness: 22 year old single female, employed. Presented to the hospital voluntarily, due to worsening depression , suicidal ideations of overdosing . States she feels she has been chronically depressed, although states " I have a few good days sometimes, but most of the time I feel depressed". States she has been feeling lonely," unimportant " and  that " nobody really cares" about her, and also reports that she just recently started a new job which has been stressful because coworkers are distant and unfriendly. States depressive symptoms have tended to worsen and states " I knew I needed help".  Endorses neuro-vegetative symptoms of depression as below. Denies psychotic symptoms. Of note, reports she attempted suicide in January 2019, by overdosing on medications that had been prescribed to her following tooth extraction, but states " I just woke up", and did not consult at the time. Associated Signs/Symptoms: Depression Symptoms:  depressed mood, anhedonia, hypersomnia, suicidal thoughts with specific plan, anxiety, loss of energy/fatigue, decreased appetite, decreased sense of self esteem (Hypo) Manic Symptoms: denies  Anxiety Symptoms: describes increased anxiety, worry, occasional panic attacks Psychotic Symptoms:  Denies  PTSD Symptoms: Does not endorse  Total Time spent with patient: 45 minutes  Past Psychiatric History: no prior psychiatric admissions, reports history of suicide attempts x 2 by overdose, last time January 2019 - did not seek treatment at the time. Denies history of self cutting .  Denies history of violence. Denies history of psychosis.Denies history of mania. Denies PTSD.  Describes history of depression, which she states is chronic. States it waxes and wanes , but feels she has been depressed " kind of my whole life ". Denies agoraphobia, describes occasional panic attacks, denies social anxiety symptoms .  Is the patient at risk to self? Yes.    Has the patient been a risk to self in the past 6 months? Yes.    Has the patient been a risk to self within the distant past? Yes.    Is the patient a risk to others? No.  Has the patient been a risk to others in the past 6 months? No.  Has the patient been a risk to others within the distant past? No.   Prior Inpatient Therapy:   Prior Outpatient Therapy:    Alcohol Screening: 1. How often do you have a drink containing alcohol?: 2 to 3 times a week 2. How many drinks containing alcohol do you have on a typical day when you are drinking?: 1 or 2 3. How often do you have six or more drinks on one occasion?: Never AUDIT-C Score: 3 4. How often during the last year have you found that you were not able to stop drinking once you had started?: Never 5. How often during the last year have you failed to do what was normally expected from you becasue of drinking?: Never 6. How often during the last year have you needed a first drink in the morning to get yourself going after a heavy drinking session?: Never 7. How often during the last year have you had a feeling of guilt of remorse after drinking?: Never 8. How often during the last  year have you been unable to remember what happened the night before because you had been drinking?: Never 9. Have you or someone else been injured as a result of your drinking?: No 10. Has a relative or friend or a doctor or another health worker been concerned about your drinking or suggested you cut down?: No Alcohol Use Disorder Identification Test Final Score (AUDIT): 3 Substance Abuse History  in the last 12 months: denies alcohol or drug abuse  Consequences of Substance Abuse: Denies  Previous Psychotropic Medications: Was not taking any psychiatric medications x 2 years. States she did take an antidepressant two years ago , prescribed by PCP, but stopped it after a couple of weeks. Does not remember name . Psychological Evaluations:  No  Past Medical History: denies medical illnesses, NKDA.  Past Medical History:  Diagnosis Date  . Blood transfusion without reported diagnosis   . Sickle cell trait [facility])     Past Surgical History:  Procedure Laterality Date  . HERNIA REPAIR    . R ovanian dermoid cyst removed  06/10/13   Family History: parents alive, separated, has two siblings Family Psychiatric  History: denies history of mental illness in family, no suicides in family, no substance abuse in family Tobacco Screening:  Does not smoke or use tobacco products  Social History: 1. Single, no children. Employed. Lives alone .  Social History   Substance and Sexual Activity  Alcohol Use Yes  . Alcohol/week: 2.0 - 4.0 standard drinks  . Types: 2 - 4 Glasses of wine per week     Social History   Substance and Sexual Activity  Drug Use No    Additional Social History: Marital status: Single Are you sexually active?: Yes What is your sexual orientation?: heterosexual Has your sexual activity been affected by drugs, alcohol, medication, or emotional stress?: n/a Does patient have children?: No  Allergies:   Allergies  Allergen Reactions  . Blueberry Flavor Itching and Swelling  . Mushroom Extract Complex Itching and Swelling   Lab Results:  Results for orders placed or performed during the hospital encounter of 08/10/18 (from the past 48 hour(s))  Comprehensive metabolic panel     Status: None   Collection Time: 08/10/18  9:03 PM  Result Value Ref Range   Sodium 141 135 - 145 mmol/L   Potassium 4.0 3.5 - 5.1 mmol/L   Chloride 108 98 - 111 mmol/L   CO2 24 22 -  32 mmol/L   Glucose, Bld 93 70 - 99 mg/dL   BUN 7 6 - 20 mg/dL   Creatinine, Ser 0.71 0.44 - 1.00 mg/dL   Calcium 9.5 8.9 - 10.3 mg/dL   Total Protein 7.8 6.5 - 8.1 g/dL   Albumin 4.4 3.5 - 5.0 g/dL   AST 19 15 - 41 U/L   ALT 12 0 - 44 U/L   Alkaline Phosphatase 52 38 - 126 U/L   Total Bilirubin 1.1 0.3 - 1.2 mg/dL   GFR calc non Af Amer >60 >60 mL/min   GFR calc Af Amer >60 >60 mL/min    Comment: (NOTE) The eGFR has been calculated using the CKD EPI equation. This calculation has not been validated in all clinical situations. eGFR's persistently <60 mL/min signify possible Chronic Kidney Disease.    Anion gap 9 5 - 15    Comment: Performed at [facility], [place] [address], [place], [place] [address]  Ethanol     Status: None   Collection Time: 08/10/18  9:03 PM  Result Value Ref Range   Alcohol, Ethyl (B) <10 <10 mg/dL    Comment: (NOTE) Lowest detectable limit for serum alcohol is 10 mg/dL. For medical purposes only. Performed at [facility], [place] [address] [place], [place] [address]   Salicylate level     Status: None   Collection Time: 08/10/18  9:03 PM  Result Value Ref Range   Salicylate Lvl <8.5 2.8 - 30.0 mg/dL    Comment: Performed at [facility], [place] [address] [place], [place] [address]  Acetaminophen level     Status: Abnormal   Collection Time: 08/10/18  9:03 PM  Result Value Ref Range   Acetaminophen (Tylenol), Serum <10 (L) 10 - 30 ug/mL    Comment: (NOTE) Therapeutic concentrations vary significantly. A range of 10-30 ug/mL  may be an effective concentration for many patients. However, some  are best treated at concentrations outside of this range. Acetaminophen concentrations >150 ug/mL at 4 hours after ingestion  and >50 ug/mL at 12 hours after ingestion are often associated with  toxic reactions. Performed at [facility], [place] [address], [place], [place] [address]   cbc      Status: Abnormal   Collection Time: 08/10/18  9:03 PM  Result Value Ref Range   WBC 5.2 4.0 - 10.5 K/uL   RBC 5.14 (H) 3.87 - 5.11 MIL/uL   Hemoglobin 9.2 (L) 12.0 - 15.0 g/dL   HCT 30.1 (L) 36.0 - 46.0 %   MCV 58.6 (L) 78.0 - 100.0 fL   MCH 17.9 (L) 26.0 - 34.0 pg   MCHC 30.6 30.0 - 36.0 g/dL   RDW 18.0 (H) 11.5 - 15.5 %   Platelets 549 (H) 150 - 400 K/uL    Comment: Performed at [facility], [place] [address], [place], [place] [address]  Rapid urine drug screen (hospital performed)     Status: Abnormal   Collection Time: 08/10/18  9:03 PM  Result Value Ref Range   Opiates (A) NONE DETECTED    Result not available. Reagent lot number recalled by manufacturer.   Cocaine NONE DETECTED NONE DETECTED   Benzodiazepines NONE DETECTED NONE DETECTED   Amphetamines NONE DETECTED NONE DETECTED   Tetrahydrocannabinol NONE DETECTED NONE DETECTED   Barbiturates NONE DETECTED NONE DETECTED    Comment: (NOTE) DRUG SCREEN FOR MEDICAL PURPOSES ONLY.  IF CONFIRMATION IS NEEDED FOR ANY PURPOSE, NOTIFY LAB WITHIN 5 DAYS. LOWEST DETECTABLE LIMITS FOR URINE DRUG SCREEN Drug Class                     Cutoff (ng/mL) Amphetamine and metabolites    1000 Barbiturate and metabolites    200 Benzodiazepine                 993 Tricyclics and metabolites     300 Opiates and metabolites        300 Cocaine and metabolites        300 THC                            50 Performed at [facility], [place] [address] [place], [place] [address]   Pregnancy, urine     Status: None   Collection Time: 08/10/18  9:04 PM  Result Value Ref Range   Preg Test, Ur NEGATIVE NEGATIVE    Comment:        THE  SENSITIVITY OF THIS METHODOLOGY IS >20 mIU/mL. Performed at [facility], [place] [address], [place], [place] [address]     Blood Alcohol level:  Lab Results  Component Value Date   ETH <10 94/17/4081    Metabolic Disorder Labs:  No results found for:  HGBA1C, MPG No results found for: PROLACTIN No results found for: CHOL, TRIG, HDL, CHOLHDL, VLDL, LDLCALC  Current Medications: Current Facility-Administered Medications  Medication Dose Route Frequency Provider Last Rate Last Dose  . acetaminophen (TYLENOL) tablet 650 mg  650 mg Oral Q6H PRN Rozetta Nunnery, NP      . alum & mag hydroxide-simeth (MAALOX/MYLANTA) 200-200-20 MG/5ML suspension 30 mL  30 mL Oral Q4H PRN Lindon Romp A, NP      . hydrOXYzine (ATARAX/VISTARIL) tablet 25 mg  25 mg Oral TID PRN Lindon Romp A, NP      . magnesium hydroxide (MILK OF MAGNESIA) suspension 30 mL  30 mL Oral Daily PRN Lindon Romp A, NP      . traZODone (DESYREL) tablet 50 mg  50 mg Oral QHS PRN Rozetta Nunnery, NP       PTA Medications: Medications Prior to Admission  Medication Sig Dispense Refill Last Dose  . levonorgestrel (MIRENA) 20 MCG/24HR IUD 1 each by Intrauterine route once.       Musculoskeletal: Strength & Muscle Tone: within normal limits Gait & Station: normal Patient leans: N/A  Psychiatric Specialty Exam: Physical Exam  Review of Systems  Constitutional: Negative.  Negative for chills and fever.  HENT: Negative.   Eyes: Negative.   Respiratory: Negative.   Cardiovascular: Negative.   Gastrointestinal: Negative for diarrhea, nausea and vomiting.  Genitourinary: Negative.   Musculoskeletal: Negative.   Skin: Positive for rash.  Neurological: Positive for headaches. Negative for seizures.  Endo/Heme/Allergies: Negative.   Psychiatric/Behavioral: Positive for depression and suicidal ideas.    Blood pressure 124/78, pulse 100, temperature 98.8 F (37.1 C), temperature source Oral, resp. rate 16, height 5' 3" (1.6 m), weight 54.4 kg, last menstrual period 07/20/2018, SpO2 100 %.Body mass index is 21.26 kg/m.  General Appearance: Fairly Groomed  Eye Contact:  Good  Speech:  Normal Rate  Volume:  Normal  Mood:  depressed, vaguely anxious   Affect:  constricted, anxious, but  smiles briefly at times during session  Thought Process:  Linear and Descriptions of Associations: Intact  Orientation:  Full (Time, Place, and Person)  Thought Content:  denies hallucinations, no delusions, not internally preoccupied   Suicidal Thoughts:  No denies suicidal or self injurious ideations, denies homicidal ideations, contracts for safety on unit   Homicidal Thoughts:  No  Memory:  recent and remote grossly intact   Judgement:  Fair  Insight:  Fair  Psychomotor Activity:  Normal  Concentration:  Concentration: Good and Attention Span: Good  Recall:  Good  Fund of Knowledge:  Good  Language:  Good  Akathisia:  Negative  Handed:  Right  AIMS (if indicated):     Assets:  Communication Skills Desire for Improvement Resilience  ADL's:  Intact  Cognition:  WNL  Sleep:       Treatment Plan Summary: Daily contact with patient to assess and evaluate symptoms and progress in treatment, Medication management, Plan inpatient treatment  and medications as above   Observation Level/Precautions:  15 minute checks  Laboratory:  as needed  TSH,anemia workup   Psychotherapy: milieu, group therapy   Medications:  We discussed options, agrees to Effexor XR trial.  Start Effexor XR 37.5 mgrs QDAY   Consultations:  As needed   Discharge Concerns:  -   Estimated LOS: 4-5 days   Other:     Physician Treatment Plan for Primary Diagnosis:  MDD, no psychotic features  Long Term Goal(s): Improvement in symptoms so as ready for discharge  Short Term Goals: Ability to identify changes in lifestyle to reduce recurrence of condition will improve and Ability to maintain clinical measurements within normal limits will improve  Physician Treatment Plan for Secondary Diagnosis: Active Problems:   Severe recurrent major depression without psychotic features ([place])  Long Term Goal(s): Improvement in symptoms so as ready for discharge  Short Term Goals: Ability to identify changes in lifestyle to  reduce recurrence of condition will improve, Ability to verbalize feelings will improve, Ability to disclose and discuss suicidal ideas, Ability to demonstrate self-control will improve, Ability to identify and develop effective coping behaviors will improve and Ability to maintain clinical measurements within normal limits will improve  I certify that inpatient services furnished can reasonably be expected to improve the patient's condition.    Jenne Campus, MD 8/20/20194:18 PM

## 2018-08-11 NOTE — Progress Notes (Signed)
D: Patient denies SI, HI or AVH this evening. Patient presents as anxious and animated but pleasant and cooperative.  Pt. States that she is adjusting well but was worried about her anxiety level at bedtime due to being in a new place.  Pt. States that her appetite is good but reports that she is a vegetarian and didn't eat much for dinner.  Pt. Is visualized on the unit interacting with staff and others.  She denies any physical complaints tonight.  A: Patient given emotional support from RN. Patient encouraged to come to staff with concerns and/or questions. Patient's medication routine continued. Patient's orders and plan of care reviewed.   R: Patient remains appropriate and cooperative. Will continue to monitor patient q15 minutes for safety.
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
## 2018-08-11 NOTE — Progress Notes (Signed)
Recreation Therapy Notes  Animal-Assisted Activity (AAA) Program Checklist/Progress Notes Patient Eligibility Criteria Checklist & Daily Group note for Rec Tx Intervention  Date: 8.20.19 Time: 53 Location: [address]   AAA/T Program Assumption of Risk Form signed by Teacher, music or Parent Legal Guardian YES   Patient is free of allergies or sever asthma YES   Patient reports no fear of animals  YES  Patient reports no history of cruelty to animals YES   Patient understands his/her participation is voluntary YES  Patient washes hands before animal contact YES   Patient washes hands after animal contact YES   Behavioral Response: Engaged  Education: Contractor, Appropriate Animal Interaction   Education Outcome: Acknowledges understanding/In group clarification offered/Needs additional education.   Clinical Observations/Feedback: Pt attended group activity.   [name], LRT/CTRS   [name] A 08/11/2018 3:29 PM
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
## 2018-08-11 NOTE — ED Notes (Signed)
Transported to [facility] by [employer]. Pt's mother had taken her belongings home. Pt was calm and cooperative.

## 2018-08-11 NOTE — Tx Team (Signed)
Initial Treatment Plan 08/11/2018 11:34 AM Patty Garcia PPH:[id number]    PATIENT STRESSORS: Occupational concerns Traumatic event   PATIENT STRENGTHS: Ability for insight Average or above average intelligence Capable of independent living General fund of knowledge Motivation for treatment/growth Physical Health Supportive family/friends   PATIENT IDENTIFIED PROBLEMS: "work on Radiographer, therapeutic for my depression"  "get a handle on my anxiety"                   DISCHARGE CRITERIA:  Ability to meet basic life and health needs Improved stabilization in mood, thinking, and/or behavior Safe-care adequate arrangements made  PRELIMINARY DISCHARGE PLAN: Return to previous living arrangement  PATIENT/FAMILY INVOLVEMENT: This treatment plan has been presented to and reviewed with the patient, Patty Garcia.  The patient and family have been given the opportunity to ask questions and make suggestions.  Baron Sane, RN 08/11/2018, 11:34 AM

## 2018-08-11 NOTE — ED Notes (Signed)
Report given to Demaris Callander at [facility]. [place] returned.

## 2018-08-12 LAB — RETICULOCYTES
RBC.: 4.81 MIL/uL (ref 3.87–5.11)
Retic Count, Absolute: 43.3 10*3/uL (ref 19.0–186.0)
Retic Ct Pct: 0.9 % (ref 0.4–3.1)

## 2018-08-12 LAB — FOLATE: Folate: 15.6 ng/mL (ref >5.9)

## 2018-08-12 LAB — IRON AND TIBC
Iron: 14 ug/dL — ABNORMAL LOW (ref 28–170)
Saturation Ratios: 3 % — ABNORMAL LOW (ref 10.4–31.8)
TIBC: 531 ug/dL — ABNORMAL HIGH (ref 250–450)
UIBC: 517 ug/dL

## 2018-08-12 LAB — TSH: TSH: 1.035 u[IU]/mL (ref 0.350–4.500)

## 2018-08-12 LAB — VITAMIN B12: Vitamin B-12: 425 pg/mL (ref 180–914)

## 2018-08-12 LAB — FERRITIN: Ferritin: 2 ng/mL — ABNORMAL LOW (ref 11–307)

## 2018-08-12 MED ORDER — IBUPROFEN 600 MG PO TABS
600.0000 mg | ORAL_TABLET | Freq: Four times a day (QID) | ORAL | Status: DC | PRN
  Administered 2018-08-12 – 2018-08-13 (×2): 600 mg via ORAL
  Filled 2018-08-12 (×2): qty 1

## 2018-08-12 MED ORDER — FERROUS SULFATE 325 (65 FE) MG PO TABS: 325.0000 mg | ORAL_TABLET | Freq: Every day | ORAL | Status: DC

## 2018-08-12 MED ORDER — FERROUS SULFATE 325 (65 FE) MG PO TABS
325.0000 mg | ORAL_TABLET | Freq: Two times a day (BID) | ORAL | Status: DC
  Administered 2018-08-12 – 2018-08-13 (×3): 325 mg via ORAL
  Filled 2018-08-12 (×11): qty 1

## 2018-08-12 MED ORDER — VENLAFAXINE HCL ER 75 MG PO CP24
75.0000 mg | ORAL_CAPSULE | Freq: Every day | ORAL | Status: DC
  Administered 2018-08-13: 75 mg via ORAL
  Filled 2018-08-12 (×3): qty 1

## 2018-08-12 NOTE — BHH Suicide Risk Assessment (Signed)
Patty Garcia INPATIENT:  Family/Significant Other Suicide Prevention Education  Suicide Prevention Education:  Education Completed; Patty Garcia, mother, [phone], has been identified by the patient as the family member/significant other with whom the patient will be residing, and identified as the person(s) who will aid the patient in the event of a mental health crisis (suicidal ideations/suicide attempt).  With written consent from the patient, the family member/significant other has been provided the following suicide prevention education, prior to the and/or following the discharge of the patient.  The suicide prevention education provided includes the following:  Suicide risk factors  Suicide prevention and interventions  National Suicide Hotline telephone number  [facility] assessment telephone number  [facility] Emergency Assistance [place] and/or Residential Mobile Crisis Unit telephone number  Request made of family/significant other to:  Remove weapons (e.g., guns, rifles, knives), all items previously/currently identified as safety concern.  No guns, per [place].  Remove drugs/medications (over-the-counter, prescriptions, illicit drugs), all items previously/currently identified as a safety concern. Mom has already gotten rid of medication from pt home.  The family member/significant other verbalizes understanding of the suicide prevention education information provided.  The family member/significant other agrees to remove the items of safety concern listed above.  Per Patty Garcia, pt has been more withdrawn lately.  Job change from [place] to [place], sick family members are current stressors.  Mom checks in with her frequently and will continue to do so.  Patty Chars, LCSW 08/12/2018, 1:28 PM

## 2018-08-12 NOTE — Tx Team (Signed)
Interdisciplinary Treatment and Diagnostic Plan Update  08/12/2018 Time of Session: 8657 Patty TAHIRIH LAIR MRN: [id number]  Principal Diagnosis: <principal problem not specified>  Secondary Diagnoses: Active Problems:   Severe recurrent major depression without psychotic features (HCC)   Current Medications:  Current Facility-Administered Medications  Medication Dose Route Frequency Provider Last Rate Last Dose  . acetaminophen (TYLENOL) tablet 650 mg  650 mg Oral Q6H PRN Rozetta Nunnery, NP      . alum & mag hydroxide-simeth (MAALOX/MYLANTA) 200-200-20 MG/5ML suspension 30 mL  30 mL Oral Q4H PRN Lindon Romp A, NP      . ferrous sulfate tablet 325 mg  325 mg Oral BID WC Cobos, Myer Peer, MD      . hydrOXYzine (ATARAX/VISTARIL) tablet 25 mg  25 mg Oral TID PRN Lindon Romp A, NP      . magnesium hydroxide (MILK OF MAGNESIA) suspension 30 mL  30 mL Oral Daily PRN Lindon Romp A, NP      . traZODone (DESYREL) tablet 50 mg  50 mg Oral QHS PRN Lindon Romp A, NP   50 mg at 08/11/18 2131  . [START ON 08/13/2018] venlafaxine XR (EFFEXOR-XR) 24 hr capsule 75 mg  75 mg Oral Q breakfast Cobos, Myer Peer, MD       PTA Medications: Medications Prior to Admission  Medication Sig Dispense Refill Last Dose  . levonorgestrel (MIRENA) 20 MCG/24HR IUD 1 each by Intrauterine route once.       Patient Stressors: Occupational concerns Traumatic event  Patient Strengths: Ability for insight Average or above average intelligence Capable of independent living General fund of knowledge Motivation for treatment/growth Physical Health Supportive family/friends  Treatment Modalities: Medication Management, Group therapy, Case management,  1 to 1 session with clinician, Psychoeducation, Recreational therapy.   Physician Treatment Plan for Primary Diagnosis: <principal problem not specified> Long Term Goal(s): Improvement in symptoms so as ready for discharge Improvement in symptoms so as ready for  discharge   Short Term Goals: Ability to identify changes in lifestyle to reduce recurrence of condition will improve Ability to maintain clinical measurements within normal limits will improve Ability to identify changes in lifestyle to reduce recurrence of condition will improve Ability to verbalize feelings will improve Ability to disclose and discuss suicidal ideas Ability to demonstrate self-control will improve Ability to identify and develop effective coping behaviors will improve Ability to maintain clinical measurements within normal limits will improve  Medication Management: Evaluate patient's response, side effects, and tolerance of medication regimen.  Therapeutic Interventions: 1 to 1 sessions, Unit Group sessions and Medication administration.  Evaluation of Outcomes: Progressing  Physician Treatment Plan for Secondary Diagnosis: Active Problems:   Severe recurrent major depression without psychotic features ([place])  Long Term Goal(s): Improvement in symptoms so as ready for discharge Improvement in symptoms so as ready for discharge   Short Term Goals: Ability to identify changes in lifestyle to reduce recurrence of condition will improve Ability to maintain clinical measurements within normal limits will improve Ability to identify changes in lifestyle to reduce recurrence of condition will improve Ability to verbalize feelings will improve Ability to disclose and discuss suicidal ideas Ability to demonstrate self-control will improve Ability to identify and develop effective coping behaviors will improve Ability to maintain clinical measurements within normal limits will improve     Medication Management: Evaluate patient's response, side effects, and tolerance of medication regimen.  Therapeutic Interventions: 1 to 1 sessions, Unit Group sessions and Medication administration.  Evaluation  of Outcomes: Progressing   RN Treatment Plan for Primary Diagnosis:  <principal problem not specified> Long Term Goal(s): Knowledge of disease and therapeutic regimen to maintain health will improve  Short Term Goals: Ability to identify and develop effective coping behaviors will improve and Compliance with prescribed medications will improve  Medication Management: RN will administer medications as ordered by provider, will assess and evaluate patient's response and provide education to patient for prescribed medication. RN will report any adverse and/or side effects to prescribing provider.  Therapeutic Interventions: 1 on 1 counseling sessions, Psychoeducation, Medication administration, Evaluate responses to treatment, Monitor vital signs and CBGs as ordered, Perform/monitor CIWA, COWS, AIMS and Fall Risk screenings as ordered, Perform wound care treatments as ordered.  Evaluation of Outcomes: Progressing   LCSW Treatment Plan for Primary Diagnosis: <principal problem not specified> Long Term Goal(s): Safe transition to appropriate next level of care at discharge, Engage patient in therapeutic group addressing interpersonal concerns.  Short Term Goals: Engage patient in aftercare planning with referrals and resources, Increase social support and Increase skills for wellness and recovery  Therapeutic Interventions: Assess for all discharge needs, 1 to 1 time with Social worker, Explore available resources and support systems, Assess for adequacy in community support network, Educate family and significant other(s) on suicide prevention, Complete Psychosocial Assessment, Interpersonal group therapy.  Evaluation of Outcomes: Progressing   Progress in Treatment: Attending groups: Yes. Participating in groups: Yes. Taking medication as prescribed: Yes. Toleration medication: Yes. Family/Significant other contact made: No, will contact:  mother Patient understands diagnosis: Yes. Discussing patient identified problems/goals with staff: Yes. Medical  problems stabilized or resolved: Yes. Denies suicidal/homicidal ideation: Yes. Issues/concerns per patient self-inventory: No. Other: none  New problem(s) identified: No, Describe:  none  New Short Term/Long Term Goal(s):  Patient Goals:  "Get better, find, better coping skills."  Discharge Plan or Barriers:   Reason for Continuation of Hospitalization: Depression Medication stabilization  Estimated Length of Stay: 2-4 days   Attendees: Patient:Patty Garcia 08/12/2018   Physician: Dr. Parke Poisson, MD 08/12/2018   Nursing: Darrol Angel, RN 08/12/2018   RN Care Manager: 08/12/2018   Social Worker: Lurline Idol, LCSW 08/12/2018   Recreational Therapist:  08/12/2018   Other:  08/12/2018   Other:  08/12/2018   Other: 08/12/2018        Scribe for Treatment Team: Joanne Chars, LCSW 08/12/2018 12:59 PM

## 2018-08-12 NOTE — Therapy (Signed)
Occupational Therapy Group Note  Date:  08/12/2018 Time:  2:58 PM  Group Topic/Focus:  Self Esteem  Participation Level:  Active  Participation Quality:  Appropriate  Affect:  Blunted  Cognitive:  Appropriate  Insight: Improving  Engagement in Group:  Engaged  Modes of Intervention:  Activity, Discussion, Education and Socialization  Additional Comments:    S: "My self esteem is about a 5/10"  O:OT tx with focus on self esteem building this date. Education given on definition of self esteem, with both causes of low and high self esteem identified. Coat of arms activity completed to identify: roles, values, goals, and favorite personal qualities. Pt then to share with peers result of activity. Positive affirmation activity administered, pt to create personal positive affirmations to apply to self to increase self esteem.   A: Pt presents to group with flat affect, engaged and participatory throughout session. Pt sharing that her personal self esteem is about a 5/10, which is affected by relationships and experiences during childhood. Pt completed coat of arms activity, sharing with other group members. Positive affirmation activity completed, pt identifying 10 new positive affirmations during activity to share with group. Improved affect noted after completion of activities.   P: Education given on self esteem and how to improve this date. Handouts and activities given to help facilitate skills when reintegrating into community.    Zenovia Jarred, MSOT, OTR/L  [place] 08/12/2018, 2:58 PM

## 2018-08-12 NOTE — Plan of Care (Signed)
Pt progressing in the following metrics  D: pt found in the dayroom; pt compliant with medication administration. Pt states she slept very well last night. Pt is brighter than she was yesterday w/o a flat affect. Pt rates her depression/hopelessness/anxiety a 1/0/2 out of 10 respectively. Pt denies any physical pain/problems. Pt states her goal for today is to share what's going on and receive help while keeping an open mind. Pt will achieve this by staying positive. Pt denies any si/hi/ah/vh and verbally agrees to approach staff if these become apparent.  A: pt given support and encouragement. Pt provided medications per protocol and standing orders. Q66m safety checks implemented and continued. R: pt safe on the unit. Will continue to monitor.   Problem: Education: Goal: Knowledge of [place] [facility] Education information/materials will improve Outcome: Progressing Goal: Emotional status will improve Outcome: Progressing Goal: Mental status will improve Outcome: Progressing Goal: Verbalization of understanding the information provided will improve Outcome: Progressing   Problem: Activity: Goal: Interest or engagement in activities will improve Outcome: Progressing Goal: Sleeping patterns will improve Outcome: Progressing   Problem: Coping: Goal: Ability to verbalize frustrations and anger appropriately will improve Outcome: Progressing Goal: Ability to demonstrate self-control will improve Outcome: Progressing

## 2018-08-12 NOTE — BHH Group Notes (Signed)
[facility] Mental Health Association Group Therapy 08/12/2018 1:15pm  Type of Therapy: Mental Health Association Presentation  Participation Level: Active  Participation Quality: Attentive  Affect: Appropriate  Cognitive: Oriented  Insight: Developing/Improving  Engagement in Therapy: Engaged  Modes of Intervention: Discussion, Education and Socialization  Summary of Progress/Problems: [place] ([place]) Speaker came to talk about his personal journey with mental health. The pt processed ways by which to relate to the speaker. Isabel speaker provided handouts and educational information pertaining to groups and services offered by the [facility]. Pt was engaged in speaker's presentation and was receptive to resources provided.    Joanne Chars, LCSW 08/12/2018 12:46 PM

## 2018-08-12 NOTE — Progress Notes (Signed)
Recreation Therapy Notes  Date: 9.21.19 Time: 0930 Location: [address] Dayroom  Group Topic: Stress Management  Goal Area(s) Addresses:  Patient will verbalize importance of using healthy stress management.  Patient will identify positive emotions associated with healthy stress management.   Intervention: Stress Management  Activity : Guided Imagery.  LRT introduced the stress management technique of guided imagery.  LRT read a script that allowed patients to envision being on the beach.  Patients were to follow along as LRT read script.  Education:  Stress Management, Discharge Planning.   Education Outcome: Acknowledges edcuation/In group clarification offered/Needs additional education  Clinical Observations/Feedback: Pt did not attend group.    [name], LRT/CTRS         Ria Comment, Dreden Rivere A 08/12/2018 10:58 AM

## 2018-08-12 NOTE — Progress Notes (Signed)
[facility] MD Progress Note  08/12/2018 9:51 AM [name]  MRN:  [id number] Subjective:  Patient reports feeling " a little better today". States she had a good visit from her mother yesterday. Denies medication side effects thus far . States she slept better last night . Denies suicidal ideations today. Objective : I have discussed case with treatment team. 22 year old single female, presented with worsening depression, sadness, suicidal ideations. At this time reports feeling better, less depressed today. No suicidal ideations today. Does present with partially improved range of affect, and although still vaguely constricted smiles at times appropriately and affect is noted to be more reactive. Has been visible in day room, going to some groups. No disruptive or agitated behaviors on unit. Labs reviewed- anemia work up reviewed.  Principal Problem: MDD Diagnosis:   Patient Active Problem List   Diagnosis Date Noted  . Severe recurrent major depression without psychotic features ([place]) [F33.2] 08/11/2018   Total Time spent with patient: 20 minutes  Past Psychiatric History:  Past Medical History:  Past Medical History:  Diagnosis Date  . Blood transfusion without reported diagnosis   . Sickle cell trait [facility])     Past Surgical History:  Procedure Laterality Date  . HERNIA REPAIR    . R ovanian dermoid cyst removed  06/10/13   Family History: History reviewed. No pertinent family history. Family Psychiatric  History:  Social History:  Social History   Substance and Sexual Activity  Alcohol Use Yes  . Alcohol/week: 2.0 - 4.0 standard drinks  . Types: 2 - 4 Glasses of wine per week     Social History   Substance and Sexual Activity  Drug Use No    Social History   Socioeconomic History  . Marital status: Single    Spouse name: Not on file  . Number of children: Not on file  . Years of education: Not on file  . Highest education level: Not on file  Occupational  History  . Not on file  Social Needs  . Financial resource strain: Not on file  . Food insecurity:    Worry: Not on file    Inability: Not on file  . Transportation needs:    Medical: Not on file    Non-medical: Not on file  Tobacco Use  . Smoking status: Never Smoker  . Smokeless tobacco: Never Used  Substance and Sexual Activity  . Alcohol use: Yes    Alcohol/week: 2.0 - 4.0 standard drinks    Types: 2 - 4 Glasses of wine per week  . Drug use: No  . Sexual activity: Never    Birth control/protection: None  Lifestyle  . Physical activity:    Days per week: Not on file    Minutes per session: Not on file  . Stress: Not on file  Relationships  . Social connections:    Talks on phone: Not on file    Gets together: Not on file    Attends religious service: Not on file    Active member of club or organization: Not on file    Attends meetings of clubs or organizations: Not on file    Relationship status: Not on file  Other Topics Concern  . Not on file  Social History Narrative  . Not on file   Additional Social History:   Sleep: Good  Appetite:  improving   Current Medications: Current Facility-Administered Medications  Medication Dose Route Frequency Provider Last Rate Last Dose  .  acetaminophen (TYLENOL) tablet 650 mg  650 mg Oral Q6H PRN Rozetta Nunnery, NP      . alum & mag hydroxide-simeth (MAALOX/MYLANTA) 200-200-20 MG/5ML suspension 30 mL  30 mL Oral Q4H PRN Lindon Romp A, NP      . hydrOXYzine (ATARAX/VISTARIL) tablet 25 mg  25 mg Oral TID PRN Lindon Romp A, NP      . magnesium hydroxide (MILK OF MAGNESIA) suspension 30 mL  30 mL Oral Daily PRN Lindon Romp A, NP      . traZODone (DESYREL) tablet 50 mg  50 mg Oral QHS PRN Lindon Romp A, NP   50 mg at 08/11/18 2131  . venlafaxine XR (EFFEXOR-XR) 24 hr capsule 37.5 mg  37.5 mg Oral Q breakfast Cobos, Myer Peer, MD   37.5 mg at 08/12/18 0745    Lab Results:  Results for orders placed or performed during the  hospital encounter of 08/11/18 (from the past 48 hour(s))  TSH     Status: None   Collection Time: 08/12/18  6:39 AM  Result Value Ref Range   TSH 1.035 0.350 - 4.500 uIU/mL    Comment: Performed by a 3rd Generation assay with a functional sensitivity of <=0.01 uIU/mL. Performed at [facility], [place] [address] [place], [place] [address]   Vitamin B12     Status: None   Collection Time: 08/12/18  6:39 AM  Result Value Ref Range   Vitamin B-12 425 180 - 914 pg/mL    Comment: (NOTE) This assay is not validated for testing neonatal or myeloproliferative syndrome specimens for Vitamin B12 levels. Performed at [facility], [place] [address] [place], [place] [address]   Folate     Status: None   Collection Time: 08/12/18  6:39 AM  Result Value Ref Range   Folate 15.6 >5.9 ng/mL    Comment: Performed at [facility], [place] [address], [place], [place] [address]  Iron and TIBC     Status: Abnormal   Collection Time: 08/12/18  6:39 AM  Result Value Ref Range   Iron 14 (L) 28 - 170 ug/dL   TIBC 531 (H) 250 - 450 ug/dL   Saturation Ratios 3 (L) 10.4 - 31.8 %   UIBC 517 ug/dL    Comment: Performed at [facility], [place] [address], [place], [place] [address]  Ferritin     Status: Abnormal   Collection Time: 08/12/18  6:39 AM  Result Value Ref Range   Ferritin 2 (L) 11 - 307 ng/mL    Comment: Performed at [facility], [place] [address] [place], [place] [address]  Reticulocytes     Status: None   Collection Time: 08/12/18  6:39 AM  Result Value Ref Range   Retic Ct Pct 0.9 0.4 - 3.1 %   RBC. 4.81 3.87 - 5.11 MIL/uL   Retic Count, Absolute 43.3 19.0 - 186.0 K/uL    Comment: Performed at [facility], [place] [address] [place], [place] [address]    Blood Alcohol level:  Lab Results  Component Value Date   ETH <10 32/35/5732    Metabolic Disorder Labs: No results found for:  HGBA1C, MPG No results found for: PROLACTIN No results found for: CHOL, TRIG, HDL, CHOLHDL, VLDL, LDLCALC  Physical Findings: AIMS: Facial and Oral Movements Muscles of Facial Expression: None, normal Lips and Perioral Area: None, normal Jaw: None, normal Tongue: None, normal,Extremity Movements Upper (arms, wrists, hands, fingers): None,  normal Lower (legs, knees, ankles, toes): None, normal, Trunk Movements Neck, shoulders, hips: None, normal, Overall Severity Severity of abnormal movements (highest score from questions above): None, normal Incapacitation due to abnormal movements: None, normal Patient's awareness of abnormal movements (rate only patient's report): No Awareness, Dental Status Current problems with teeth and/or dentures?: No Does patient usually wear dentures?: No  CIWA:    COWS:     Musculoskeletal: Strength & Muscle Tone: within normal limits Gait & Station: normal Patient leans: N/A  Psychiatric Specialty Exam: Physical Exam  ROS  Mild headache, no chest pain ,no shortness of breath, no vomiting, no fever , no chills   Blood pressure (!) 102/58, pulse (!) 104, temperature 98.3 F (36.8 C), temperature source Oral, resp. rate 16, height 5\' 3"  (1.6 m), weight 54.4 kg, last menstrual period 07/20/2018, SpO2 100 %.Body mass index is 21.26 kg/m.  General Appearance: Well Groomed  Eye Contact:  Good  Speech:  Normal Rate  Volume:  Normal  Mood:  reports mood is better today  Affect:  still constricted, but improving , and smiles at times appropriately   Thought Process:  Linear and Descriptions of Associations: Intact  Orientation:  Other:  fully alert and attentive  Thought Content:  no hallucinations, no delusions, not internally preoccupied   Suicidal Thoughts:  No- no suicidal or self injurious ideations noted or reported   Homicidal Thoughts:  No-  no homicidal or violent ideations  Memory:  recent and remote grossly intact   Judgement:  Other:   improving   Insight:  Improving   Psychomotor Activity:  Normal  Concentration:  Concentration: Good and Attention Span: Good  Recall:  Good  Fund of Knowledge:  Good  Language:  Good  Akathisia:  Negative  Handed:  Right  AIMS (if indicated):     Assets:  Communication Skills Desire for Improvement Resilience  ADL's:  Intact  Cognition:  WNL  Sleep:  Number of Hours: 6.75   Assessment - 22 year old , history of depression, presented due to worsening depression, suicidal ideations. One stressor is that she recently started a new job . Currently reports feeling better. Remains depressed, but mood has improved since admission and affect is more reactive. Denies SI at this time. Thus far tolerating Effexor XR trial well . Patient reports history of anemia, and anemia work up consistent with iron deficiency. She does report history of Sickle Cell trait.  She has been prescribed Iron supplement in the past, but has not been taking recently  Treatment Plan Summary: Daily contact with patient to assess and evaluate symptoms and progress in treatment, Medication management, Plan inpatient treatment  and medications as below Encourage group and milieu participation to work on coping skills and symptom reduction Increase Effexor XR to 75 mgrs QDAY for depression  Continue Trazodone 50 mgrs QHS PRN for insomnia Continue Vistaril 25 mgrs Q 8 hours PRN for anxiety Have consulted Hospitalist regarding anemia treatment/management - recommendation is to start FeSO4 325 mgrs BID with meals  Treatment team working on disposition planning options  Jenne Campus, MD 08/12/2018, 9:51 AM

## 2018-08-13 MED ORDER — MIRTAZAPINE 15 MG PO TABS
15.0000 mg | ORAL_TABLET | Freq: Every day | ORAL | Status: DC
  Administered 2018-08-13 – 2018-08-14 (×2): 15 mg via ORAL
  Filled 2018-08-13 (×4): qty 1

## 2018-08-13 MED ORDER — MIRTAZAPINE 7.5 MG PO TABS
7.5000 mg | ORAL_TABLET | Freq: Every day | ORAL | Status: DC
  Filled 2018-08-13 (×2): qty 1

## 2018-08-13 MED ORDER — ASPIRIN-ACETAMINOPHEN-CAFFEINE 250-250-65 MG PO TABS
2.0000 | ORAL_TABLET | Freq: Once | ORAL | Status: AC
  Administered 2018-08-13: 2 via ORAL
  Filled 2018-08-13: qty 2

## 2018-08-13 NOTE — [facility] Group Notes (Signed)
[facility] LCSW Group Therapy Note  Date/Time: 08/13/18, 1315  Type of Therapy/Topic:  Group Therapy:  Balance in Life  Participation Level:  moderate  Description of Group:    This group will address the concept of balance and how it feels and looks when one is unbalanced. Patients will be encouraged to process areas in their lives that are out of balance, and identify reasons for remaining unbalanced. Facilitators will guide patients utilizing problem- solving interventions to address and correct the stressor making their life unbalanced. Understanding and applying boundaries will be explored and addressed for obtaining  and maintaining a balanced life. Patients will be encouraged to explore ways to assertively make their unbalanced needs known to significant others in their lives, using other group members and facilitator for support and feedback.  Therapeutic Goals: 1. Patient will identify two or more emotions or situations they have that consume much of in their lives. 2. Patient will identify signs/triggers that life has become out of balance:  3. Patient will identify two ways to set boundaries in order to achieve balance in their lives:  4. Patient will demonstrate ability to communicate their needs through discussion and/or role plays  Summary of Patient Progress:Pt shared that physical, work, and mental/emotional are all areas currently out of balance.  Pt made several comments during group discussion and was attentive throughout.           Therapeutic Modalities:   Cognitive Behavioral Therapy Solution-Focused Therapy Assertiveness Training  Lurline Idol, [place]

## 2018-08-13 NOTE — Plan of Care (Signed)
  Problem: Education: Goal: Knowledge of [facility] Education information/materials will improve Outcome: Progressing   Problem: Activity: Goal: Interest or engagement in activities will improve Outcome: Progressing   Problem: Health Behavior/Discharge Planning: Goal: Compliance with treatment plan for underlying cause of condition will improve Outcome: Progressing   Problem: Safety: Goal: Periods of time without injury will increase Outcome: Progressing   

## 2018-08-13 NOTE — Progress Notes (Signed)
[facility] MD Progress Note  08/13/2018 12:34 PM [name]  MRN:  [id number] Subjective: Patient reports feeling "a little better" than she did prior to admission but describes significant headache over the last 2 days.  States that headache has been persistent and describes it as migrainous/hemicranial/with some photophobia particularly on right eye. She does have a history of headaches but they are not frequent. Denies suicidal ideations. Objective : I have discussed case with treatment team. 22 year old single female, presented with worsening depression, sadness, suicidal ideations. Patient reports, as above, headache x2 days.  Feels Tylenol has not helped particularly, and that ibuprofen has been more effective but only partially helpful.  She is on Effexor XR (new antidepressant trial) and is taking Trazodone as needed at night for insomnia.  We have reviewed side effect profile/both of these medications may be associated with headache.  We discussed options.  She is currently on iron supplement for history of iron deficiency anemia.  States that she has been on iron before and it has caused some vague nausea/dizziness but no significant constipation.  At this time agrees to continue.  Of note vitals are stable, denies dizziness or lightheadedness. Visible on unit, going to groups, behavior on unit in good control.  Principal Problem: MDD Diagnosis:   Patient Active Problem List   Diagnosis Date Noted  . Severe recurrent major depression without psychotic features ([place]) [F33.2] 08/11/2018   Total Time spent with patient: 20 minutes  Past Psychiatric History:  Past Medical History:  Past Medical History:  Diagnosis Date  . Blood transfusion without reported diagnosis   . Sickle cell trait [facility])     Past Surgical History:  Procedure Laterality Date  . HERNIA REPAIR    . R ovanian dermoid cyst removed  06/10/13   Family History: History reviewed. No pertinent family  history. Family Psychiatric  History:  Social History:  Social History   Substance and Sexual Activity  Alcohol Use Yes  . Alcohol/week: 2.0 - 4.0 standard drinks  . Types: 2 - 4 Glasses of wine per week     Social History   Substance and Sexual Activity  Drug Use No    Social History   Socioeconomic History  . Marital status: Single    Spouse name: Not on file  . Number of children: Not on file  . Years of education: Not on file  . Highest education level: Not on file  Occupational History  . Not on file  Social Needs  . Financial resource strain: Not on file  . Food insecurity:    Worry: Not on file    Inability: Not on file  . Transportation needs:    Medical: Not on file    Non-medical: Not on file  Tobacco Use  . Smoking status: Never Smoker  . Smokeless tobacco: Never Used  Substance and Sexual Activity  . Alcohol use: Yes    Alcohol/week: 2.0 - 4.0 standard drinks    Types: 2 - 4 Glasses of wine per week  . Drug use: No  . Sexual activity: Never    Birth control/protection: None  Lifestyle  . Physical activity:    Days per week: Not on file    Minutes per session: Not on file  . Stress: Not on file  Relationships  . Social connections:    Talks on phone: Not on file    Gets together: Not on file    Attends religious service: Not on file  Active member of club or organization: Not on file    Attends meetings of clubs or organizations: Not on file    Relationship status: Not on file  Other Topics Concern  . Not on file  Social History Narrative  . Not on file   Additional Social History:   Sleep: Good  Appetite:  improving   Current Medications: Current Facility-Administered Medications  Medication Dose Route Frequency Provider Last Rate Last Dose  . alum & mag hydroxide-simeth (MAALOX/MYLANTA) 200-200-20 MG/5ML suspension 30 mL  30 mL Oral Q4H PRN Lindon Romp A, NP      . ferrous sulfate tablet 325 mg  325 mg Oral BID WC Antony Sian, Myer Peer, MD   325 mg at 08/13/18 0842  . hydrOXYzine (ATARAX/VISTARIL) tablet 25 mg  25 mg Oral TID PRN Rozetta Nunnery, NP   25 mg at 08/12/18 2009  . ibuprofen (ADVIL,MOTRIN) tablet 600 mg  600 mg Oral Q6H PRN Lindon Romp A, NP   600 mg at 08/13/18 0842  . magnesium hydroxide (MILK OF MAGNESIA) suspension 30 mL  30 mL Oral Daily PRN Lindon Romp A, NP      . mirtazapine (REMERON) tablet 7.5 mg  7.5 mg Oral QHS Daphnee Preiss, Myer Peer, MD        Lab Results:  Results for orders placed or performed during the hospital encounter of 08/11/18 (from the past 48 hour(s))  TSH     Status: None   Collection Time: 08/12/18  6:39 AM  Result Value Ref Range   TSH 1.035 0.350 - 4.500 uIU/mL    Comment: Performed by a 3rd Generation assay with a functional sensitivity of <=0.01 uIU/mL. Performed at [facility], [place] [address] [place], [place] [address]   Vitamin B12     Status: None   Collection Time: 08/12/18  6:39 AM  Result Value Ref Range   Vitamin B-12 425 180 - 914 pg/mL    Comment: (NOTE) This assay is not validated for testing neonatal or myeloproliferative syndrome specimens for Vitamin B12 levels. Performed at [facility], [place] [address] [place], [place] [address]   Folate     Status: None   Collection Time: 08/12/18  6:39 AM  Result Value Ref Range   Folate 15.6 >5.9 ng/mL    Comment: Performed at [facility], [place] [address], [place], [place] [address]  Iron and TIBC     Status: Abnormal   Collection Time: 08/12/18  6:39 AM  Result Value Ref Range   Iron 14 (L) 28 - 170 ug/dL   TIBC 531 (H) 250 - 450 ug/dL   Saturation Ratios 3 (L) 10.4 - 31.8 %   UIBC 517 ug/dL    Comment: Performed at [facility], [place] [address], [place], [place] [address]  Ferritin     Status: Abnormal   Collection Time: 08/12/18  6:39 AM  Result Value Ref Range   Ferritin 2 (L) 11 - 307 ng/mL    Comment: Performed at [facility], [place] [address], [place], [place] [address]  Reticulocytes     Status: None   Collection Time: 08/12/18  6:39 AM  Result Value Ref Range   Retic Ct Pct 0.9 0.4 - 3.1 %   RBC. 4.81 3.87 - 5.11 MIL/uL   Retic Count, Absolute 43.3 19.0 - 186.0 K/uL    Comment: Performed at [facility], [place] [address] [place], [place] [address]  Blood Alcohol level:  Lab Results  Component Value Date   ETH <10 17/51/0258    Metabolic Disorder Labs: No results found for: HGBA1C, MPG No results found for: PROLACTIN No results found for: CHOL, TRIG, HDL, CHOLHDL, VLDL, LDLCALC  Physical Findings: AIMS: Facial and Oral Movements Muscles of Facial Expression: None, normal Lips and Perioral Area: None, normal Jaw: None, normal Tongue: None, normal,Extremity Movements Upper (arms, wrists, hands, fingers): None, normal Lower (legs, knees, ankles, toes): None, normal, Trunk Movements Neck, shoulders, hips: None, normal, Overall Severity Severity of abnormal movements (highest score from questions above): None, normal Incapacitation due to abnormal movements: None, normal Patient's awareness of abnormal movements (rate only patient's report): No Awareness, Dental Status Current problems with teeth and/or dentures?: No Does patient usually wear dentures?: No  CIWA:    COWS:     Musculoskeletal: Strength & Muscle Tone: within normal limits Gait & Station: normal Patient leans: N/A  Psychiatric Specialty Exam: Physical Exam  ROSheadache, describes as migrainous-  no chest pain ,no shortness of breath, mild nausea ,no vomiting, no fever , no chills   Blood pressure 104/66, pulse 88, temperature 98.6 F (37 C), temperature source Oral, resp. rate 16, height 5\' 3"  (1.6 m), weight 54.4 kg, last menstrual period 07/20/2018, SpO2 100 %.Body mass index is 21.26 kg/m.  General Appearance: Well Groomed  Eye Contact:  Good  Speech:  Normal Rate  Volume:  Normal   Mood:  Reports partially improved mood but states she continues to feel vaguely depressed and feels that above reported headache is contributing to her not feeling better than she is at this time  Affect:  Vaguely constricted, smiles briefly at times  Thought Process:  Linear and Descriptions of Associations: Intact  Orientation:  Other:  fully alert and attentive  Thought Content:  no hallucinations, no delusions, not internally preoccupied   Suicidal Thoughts:  No- no suicidal or self injurious ideations noted or reported   Homicidal Thoughts:  No-  no homicidal or violent ideations  Memory:  recent and remote grossly intact   Judgement:  Other:  improving   Insight:  Improving   Psychomotor Activity:  Normal  Concentration:  Concentration: Good and Attention Span: Good  Recall:  Good  Fund of Knowledge:  Good  Language:  Good  Akathisia:  Negative  Handed:  Right  AIMS (if indicated):     Assets:  Communication Skills Desire for Improvement Resilience  ADL's:  Intact  Cognition:  WNL  Sleep:  Number of Hours: 6.75   Assessment - 22 year old , history of depression, presented due to worsening depression, suicidal ideations. One stressor is that she recently started a new job .  Patient reports headache x2 days.  It has been mild initially but has become more significant.  Describes it as migrainous/hemicranial/with some associated right eye photophobia.  This symptom appears to be temporally related to no medication trials (Effexor XR and Trazodone nightly PRN).  We discussed options-will discontinue Effexor XR, discontinue Trazodone, start Remeron for depression, anxiety, insomnia Treatment Plan Summary: Daily contact with patient to assess and evaluate symptoms and progress in treatment, Medication management, Plan inpatient treatment  and medications as below  Treatment plan reviewed as below today 8/ 22 Encourage group and milieu participation to work on coping skills and symptom  reduction D/C Effexor XR-see above D/C Trazodone- see above  Start Remeron 15 mg nightly for depression, anxiety, insomnia Continue Vistaril 25 mgrs Q 8 hours PRN for  anxiety Continue FeSO4 325 mgrs BID with meals for iron deficiency anemia Treatment team working on disposition planning options  Continue Ibuprofen PRN for headache.   Jenne Campus, MD 08/13/2018, 12:34 PM   Patient ID: Patty Garcia, female   DOB: Jun 16, 1996, 22 y.o.   MRN: [id number]

## 2018-08-13 NOTE — Progress Notes (Signed)
Pt reports an ongoing headache this evening that she says started this morning and has not been relieved by Tylenol.  She denies SI/HI/AVH at this time.  She says other than the headache, she has had a fair day.  She feels the headache may be the result of her medications.  Pt has been pleasant and cooperative with staff.  She has been in the dayroom most of the evening socializing with peers.  Support and encouragement offered.  Pt was given Vistaril 25 mg to help her relax until an order for Motrin could be obtained.  When available, Motrin 600 mg was given along with prn Trazodone 50 mg for sleep.  Discharge plans are in process.  Safety maintained with q15 minute checks.

## 2018-08-13 NOTE — Progress Notes (Signed)
Patient ID: Patty Garcia Athena Masse, female   DOB: 08/16/1996, 22 y.o.   MRN: [id number]  Nursing Progress Note [id number]  Data: Patient presents with flat affect and depressed mood. Patient continues to endorse a significant headache unrelieved with use of Ibuprofen or Tylenol. MD aware and new orders received. Patient complaint with scheduled medications. Patient completed self-inventory sheet and rates depression, hopelessness, and anxiety 0,0,0 respectively. Patient rates their sleep and appetite as fair/good respectively. Patient states goal for today is to "keep an open mind". Patient is seen attending groups and visible in the milieu. Patient currently denies SI/HI/AVH. Patient reported to M[facility] she does not eat beef or pork and is a pescatarian. Patient reports she has had limited intake due to this concern. MD made aware and new orders received for diet. Dining staff made aware and patient able to request meals for lunch and dinner.  Action: Patient educated about and provided medication per provider's orders. Patient safety maintained with q15 min safety checks and frequent rounding. Low fall risk precautions in place. Emotional support given. 1:1 interaction and active listening provided. Patient encouraged to attend meals and groups. Patient encouraged to work on treatment plan and goals. Labs, vital signs and patient behavior monitored throughout shift.   Response: Patient agrees to come to staff if any thoughts of SI/HI develop or if patient develops intention of acting on thoughts. Patient remains safe on the unit at this time. Patient is interacting with peers appropriately on the unit. Will continue to support and monitor.

## 2018-08-14 NOTE — Progress Notes (Signed)
D: Pt was in hallway upon initial approach.  Pt presents with appropriate affect and mood.  She smiles with interaction.  Describes her day as "good."  Reports goal was "to open up more" and she has met goal.  Pt denies SI/HI, denies hallucinations, denies pain.  Pt has been visible in milieu interacting with peers and staff appropriately.  Pt attended evening group.    A: Introduced self to pt.  Actively listened to pt and offered support and encouragement. Medication administered per order.  Q15 minute safety checks maintained.  R: Pt is safe on the unit.  Pt is compliant with medications.  Pt verbally contracts for safety.  Will continue to monitor and assess.

## 2018-08-14 NOTE — Plan of Care (Signed)
  Problem: Education: Goal: Mental status will improve Outcome: Progressing   Patient reports improved mood and denies SI/HI/AVH.

## 2018-08-14 NOTE — Progress Notes (Signed)
[facility] MD Progress Note  08/14/2018 5:59 PM Patty Garcia  MRN:  [id number] Subjective: Patient reports she is feeling "a lot better today".  She states headache, which have been severe and debilitating yesterday, has fully resolved and denies headache at this time.  Denies current medication side effects.  Denies suicidal ideations.  As she improves she is becoming more future oriented, hopeful for discharge soon. Objective : I have discussed case with treatment team. 22 year old single female, presented with worsening depression, sadness, suicidal ideations. As above, patient reports improving mood, feels better today, does present with a fuller range of affect, denies suicidal ideations, at this time presents future oriented.  Headache has resolved. She is now off Effexor XR and has been started on Remeron for depression and insomnia which she is tolerating well thus far. Denies medication side effects.  Denies suicidal ideations. Visible on unit, pleasant on approach, behavior and on unit in good control.  Principal Problem: MDD Diagnosis:   Patient Active Problem List   Diagnosis Date Noted  . Severe recurrent major depression without psychotic features ([place]) [F33.2] 08/11/2018   Total Time spent with patient: 15 minutes  Past Psychiatric History:  Past Medical History:  Past Medical History:  Diagnosis Date  . Blood transfusion without reported diagnosis   . Sickle cell trait [facility])     Past Surgical History:  Procedure Laterality Date  . HERNIA REPAIR    . R ovanian dermoid cyst removed  06/10/13   Family History: History reviewed. No pertinent family history. Family Psychiatric  History:  Social History:  Social History   Substance and Sexual Activity  Alcohol Use Yes  . Alcohol/week: 2.0 - 4.0 standard drinks  . Types: 2 - 4 Glasses of wine per week     Social History   Substance and Sexual Activity  Drug Use No    Social History   Socioeconomic History   . Marital status: Single    Spouse name: Not on file  . Number of children: Not on file  . Years of education: Not on file  . Highest education level: Not on file  Occupational History  . Not on file  Social Needs  . Financial resource strain: Not on file  . Food insecurity:    Worry: Not on file    Inability: Not on file  . Transportation needs:    Medical: Not on file    Non-medical: Not on file  Tobacco Use  . Smoking status: Never Smoker  . Smokeless tobacco: Never Used  Substance and Sexual Activity  . Alcohol use: Yes    Alcohol/week: 2.0 - 4.0 standard drinks    Types: 2 - 4 Glasses of wine per week  . Drug use: No  . Sexual activity: Never    Birth control/protection: None  Lifestyle  . Physical activity:    Days per week: Not on file    Minutes per session: Not on file  . Stress: Not on file  Relationships  . Social connections:    Talks on phone: Not on file    Gets together: Not on file    Attends religious service: Not on file    Active member of club or organization: Not on file    Attends meetings of clubs or organizations: Not on file    Relationship status: Not on file  Other Topics Concern  . Not on file  Social History Narrative  . Not on file  Additional Social History:   Sleep: Good  Appetite:  improving   Current Medications: Current Facility-Administered Medications  Medication Dose Route Frequency Provider Last Rate Last Dose  . alum & mag hydroxide-simeth (MAALOX/MYLANTA) 200-200-20 MG/5ML suspension 30 mL  30 mL Oral Q4H PRN Lindon Romp A, NP      . ferrous sulfate tablet 325 mg  325 mg Oral BID WC Cobos, Myer Peer, MD   325 mg at 08/13/18 1700  . hydrOXYzine (ATARAX/VISTARIL) tablet 25 mg  25 mg Oral TID PRN Rozetta Nunnery, NP   25 mg at 08/12/18 2009  . ibuprofen (ADVIL,MOTRIN) tablet 600 mg  600 mg Oral Q6H PRN Lindon Romp A, NP   600 mg at 08/13/18 0842  . magnesium hydroxide (MILK OF MAGNESIA) suspension 30 mL  30 mL Oral  Daily PRN Lindon Romp A, NP      . mirtazapine (REMERON) tablet 15 mg  15 mg Oral QHS Cobos, Myer Peer, MD   15 mg at 08/13/18 2207    Lab Results:  No results found for this or any previous visit (from the past 48 hour(s)).  Blood Alcohol level:  Lab Results  Component Value Date   ETH <10 84/13/2440    Metabolic Disorder Labs: No results found for: HGBA1C, MPG No results found for: PROLACTIN No results found for: CHOL, TRIG, HDL, CHOLHDL, VLDL, LDLCALC  Physical Findings: AIMS: Facial and Oral Movements Muscles of Facial Expression: None, normal Lips and Perioral Area: None, normal Jaw: None, normal Tongue: None, normal,Extremity Movements Upper (arms, wrists, hands, fingers): None, normal Lower (legs, knees, ankles, toes): None, normal, Trunk Movements Neck, shoulders, hips: None, normal, Overall Severity Severity of abnormal movements (highest score from questions above): None, normal Incapacitation due to abnormal movements: None, normal Patient's awareness of abnormal movements (rate only patient's report): No Awareness, Dental Status Current problems with teeth and/or dentures?: No Does patient usually wear dentures?: No  CIWA:    COWS:     Musculoskeletal: Strength & Muscle Tone: within normal limits Gait & Station: normal Patient leans: N/A  Psychiatric Specialty Exam: Physical Exam  ROS reports headache has resolved, no visual disturbances today,no vomiting, no fever , no chills   Blood pressure 108/64, pulse 85, temperature 99.7 F (37.6 C), temperature source Oral, resp. rate (!) 24, height 5\' 3"  (1.6 m), weight 54.4 kg, last menstrual period 07/20/2018, SpO2 100 %.Body mass index is 21.26 kg/m.  General Appearance: Well Groomed  Eye Contact:  Good  Speech:  Normal Rate  Volume:  Normal  Mood:  Reports improved mood  Affect:  Affect is fuller in range  Thought Process:  Linear and Descriptions of Associations: Intact  Orientation:  Other:  fully  alert and attentive  Thought Content:  no hallucinations, no delusions, not internally preoccupied   Suicidal Thoughts:  No- no suicidal or self injurious ideations noted or reported   Homicidal Thoughts:  No-  no homicidal or violent ideations  Memory:  recent and remote grossly intact   Judgement:  Other:  improving   Insight:  Improving   Psychomotor Activity:  Normal  Concentration:  Concentration: Good and Attention Span: Good  Recall:  Good  Fund of Knowledge:  Good  Language:  Good  Akathisia:  Negative  Handed:  Right  AIMS (if indicated):     Assets:  Communication Skills Desire for Improvement Resilience  ADL's:  Intact  Cognition:  WNL  Sleep:  Number of Hours: 6.5   Assessment -  22 year old , history of depression, presented due to worsening depression, suicidal ideations.  Today patient reports feeling better, describes improved mood, does present with a fuller range of affect, denies suicidal ideations and currently presents future oriented.  She had been experiencing a severe headache yesterday which fortunately has resolved.  She is now off Effexor XR as it was felt that this medication trial was temporally related to above-mentioned headache and is currently on Remeron trial which she is tolerating well thus far.  Treatment Plan Summary: Daily contact with patient to assess and evaluate symptoms and progress in treatment, Medication management, Plan inpatient treatment  and medications as below  Treatment plan reviewed as below today 8/ 23 Encourage group and milieu participation to work on coping skills and symptom reduction Continue Remeron 15 mg nightly for depression, anxiety, insomnia Continue Vistaril 25 mgrs Q 8 hours PRN for anxiety Continue FeSO4 325 mgrs BID with meals for iron deficiency anemia Treatment team working on disposition planning options  Continue Ibuprofen PRN for headache.  Treatment team working on disposition plans.  Jenne Campus,  MD 08/14/2018, 5:59 PM   Patient ID: Patty Garcia, female   DOB: 1996-08-25, 22 y.o.   MRN: [id number] Patient ID: Patty Garcia, female   DOB: 1996-04-05, 22 y.o.   MRN: [id number]

## 2018-08-14 NOTE — Progress Notes (Signed)
D: Pt was in dayroom upon initial approach.  Pt presents with appropriate affect and mood.  She describes her day as "good" and reports goal is to "stay positive."  Reports she met goal.  Pt reports she had a good visit with her mother and "god sisters."  Pt denies SI/HI, denies hallucinations, denies pain.  Pt has been visible in milieu interacting with peers and staff appropriately.  Pt attended evening group.    A: Introduced self to pt.  Actively listened to pt and offered support and encouragement. Medication administered per order.  Pt provided with ear plugs because she is worried about her roommate snoring.  Q15 minute safety checks maintained.  R: Pt is safe on the unit.  Pt is compliant with medications.  Pt verbally contracts for safety.  Will continue to monitor and assess.

## 2018-08-14 NOTE — Progress Notes (Signed)
Recreation Therapy Notes  Date: 8.23.19 Time: 0930 Location: [address] Dayroom  Group Topic: Stress Management  Goal Area(s) Addresses:  Patient will verbalize importance of using healthy stress management.  Patient will identify positive emotions associated with healthy stress management.   Intervention: Stress Management  Activity :  Progressive Muscle Relaxation.  LRT introduced the stress management technique of progressive muscle relaxation.  LRT read a script on tensing then relaxing each muscle group individually.  Patients were to follow along as LRT read the script to engage in the activity.  Education:  Stress Management, Discharge Planning.   Education Outcome: Acknowledges edcuation/In group clarification offered/Needs additional education  Clinical Observations/Feedback: Patient did not attend group.    [name], LRT/CTRS         Ria Comment, Lamiyah Schlotter A 08/14/2018 12:12 PM

## 2018-08-14 NOTE — Progress Notes (Addendum)
Adult Psychoeducational Group Note  Date:  08/14/2018 Time:  1:56 AM  Group Topic/Focus:  Wrap-Up Group:   The focus of this group is to help patients review their daily goal of treatment and discuss progress on daily workbooks.  Participation Level:  Active  Participation Quality:  Appropriate  Affect:  Appropriate  Cognitive:  Appropriate  Insight: Appropriate  Engagement in Group:  Engaged  Modes of Intervention:  Discussion  Additional Comments:  Pt met goal today to open up more with the other patients.  Pt stated her day started of rough but once meds started to kick in she was feeling better about her depression.  Pt started and lead an impromtu peer support group this afternoon and it made her feel good and somewhat accomplished.  Pt rated the day at a 9/10.  Patty Garcia 08/14/2018, 1:56 AM

## 2018-08-14 NOTE — Progress Notes (Signed)
Patient ID: Patty Garcia, female   DOB: 1996-01-31, 22 y.o.   MRN: [id number]  Nursing Progress Note [id number]  Data: Patient presents with bright, animated affect and pleasant mood. Patient refused her iron pill this morning and states, "it makes me feel sick". MD notified. Patient denies pain/physical complaints. Patient completed self-inventory sheet and rates depression, hopelessness, and anxiety 0,0,0 respectively. Patient rates their sleep and appetite as good/good respectively. Patient states goal for today is to "just have a good day". Patient is seen attending groups and visible in the milieu. Patient currently denies SI/HI/AVH.   Action: Patient educated about and provided medication per provider's orders. Patient safety maintained with q15 min safety checks and frequent rounding. Low fall risk precautions in place. Emotional support given. 1:1 interaction and active listening provided. Patient encouraged to attend meals and groups. Patient encouraged to work on treatment plan and goals. Labs, vital signs and patient behavior monitored throughout shift.   Response: Patient agrees to come to staff if any thoughts of SI/HI develop or if patient develops intention of acting on thoughts. Patient remains safe on the unit at this time. Patient is interacting with peers appropriately on the unit. Will continue to support and monitor.

## 2018-08-14 NOTE — BHH Group Notes (Signed)
  Learned LCSW Group Therapy Note  Date/Time: 08/14/18, 1315  Type of Therapy/Topic:  Group Therapy:  Emotion Regulation  Participation Level:  Did Not Attend   Mood:  Description of Group:    The purpose of this group is to assist patients in learning to regulate negative emotions and experience positive emotions. Patients will be guided to discuss ways in which they have been vulnerable to their negative emotions. These vulnerabilities will be juxtaposed with experiences of positive emotions or situations, and patients challenged to use positive emotions to combat negative ones. Special emphasis will be placed on coping with negative emotions in conflict situations, and patients will process healthy conflict resolution skills.  Therapeutic Goals: 1. Patient will identify two positive emotions or experiences to reflect on in order to balance out negative emotions:  2. Patient will label two or more emotions that they find the most difficult to experience:  3. Patient will be able to demonstrate positive conflict resolution skills through discussion or role plays:   Summary of Patient Progress:       Therapeutic Modalities:   Cognitive Behavioral Therapy Feelings Identification Dialectical Behavioral Therapy  Lurline Idol, LCSW

## 2018-08-15 MED ORDER — MIRTAZAPINE 15 MG PO TABS: 15.0000 mg | ORAL_TABLET | Freq: Every day | ORAL | 0 refills | Status: AC

## 2018-08-15 MED ORDER — FERROUS SULFATE 325 (65 FE) MG PO TABS: 325.0000 mg | ORAL_TABLET | Freq: Two times a day (BID) | ORAL | 0 refills | Status: AC

## 2018-08-15 MED ORDER — HYDROXYZINE HCL 25 MG PO TABS: 25.0000 mg | ORAL_TABLET | Freq: Three times a day (TID) | ORAL | 0 refills | Status: AC | PRN

## 2018-08-15 NOTE — BHH Suicide Risk Assessment (Signed)
[facility] Discharge Suicide Risk Assessment   Principal Problem: Severe recurrent major depression without psychotic features [facility]) Discharge Diagnoses:  Patient Active Problem List   Diagnosis Date Noted  . Severe recurrent major depression without psychotic features ([place]) [F33.2] 08/11/2018    Total Time spent with patient: 30 minutes  Musculoskeletal: Strength & Muscle Tone: within normal limits Gait & Station: normal Patient leans: N/A  Psychiatric Specialty Exam: ROS no headache, no chest pain, no shortness of breath, no vomiting   Blood pressure 106/65, pulse 75, temperature 99.7 F (37.6 C), temperature source Oral, resp. rate 20, height 5\' 3"  (1.6 m), weight 54.4 kg, last menstrual period 07/20/2018, SpO2 100 %.Body mass index is 21.26 kg/m.  General Appearance: Well Groomed  Eye Contact::  Good  Speech:  Normal Rate409  Volume:  Normal  Mood:  improved, reports " I feel a lot better "  Affect:  Appropriate and more reactive  Thought Process:  Linear and Descriptions of Associations: Intact  Orientation:  Full (Time, Place, and Person)  Thought Content:  no hallucinations, no delusions, not internally preoccupied   Suicidal Thoughts:  No denies suicidal or self injurious ideations, denies homicidal or violent ideations  Homicidal Thoughts:  No  Memory:  recent and remote grossly intact   Judgement:  Other:  improving   Insight:  improving   Psychomotor Activity:  Normal  Concentration:  Good  Recall:  Good  Fund of Knowledge:Good  Language: Good  Akathisia:  Negative  Handed:  Right  AIMS (if indicated):     Assets:  Desire for Improvement Resilience  Sleep:  Number of Hours: 6.25  Cognition: WNL  ADL's:  Intact   Mental Status Per Nursing Assessment::   On Admission:  Suicidal ideation indicated by patient, Self-harm thoughts  Demographic Factors:  22, no children, lives alone,employed   Loss Factors: Job related stressors   Historical Factors: No prior  psychiatric admissions, history of suicide attempt by overdosing in January 2019, no history of self cutting. History of depression  Risk Reduction Factors:   Employed, Positive social support and Positive coping skills or problem solving skills  Continued Clinical Symptoms:  Alert, attentive, well related, pleasant, mood improved, affect fuller in range, no thought disorder, no suicidal or self injurious ideations, no homicidal or violent ideations, no psychotic symptoms, future oriented. Denies medication side effects- we reviewed side effect profile . Behavior on unit in good control, pleasant on approach  Cognitive Features That Contribute To Risk:  No gross cognitive deficits noted upon discharge. Is alert , attentive, and oriented x 3   Suicide Risk:  Mild:  Suicidal ideation of limited frequency, intensity, duration, and specificity.  There are no identifiable plans, no associated intent, mild dysphoria and related symptoms, good self-control (both objective and subjective assessment), few other risk factors, and identifiable protective factors, including available and accessible social support.  [place]. Go on 08/25/2018.   Why:  Please attend your medication appt with Leanor Kail on Tuesday, 08/25/18, at 12:30.  Please call mood treatment within 24 hours of discharge from [facility] to pay $20 deposit. Contact information: [address]  [place], [place] [address] P: [place]. Go on 08/21/2018.   Why:  Please attend your therapy appt with Cam Hines on Friday, 08/21/18, at 12:00noon.   Contact information: [address]  [place], [place] [address] P; [phone]  Plan Of Care/Follow-up recommendations:  Activity:  as tolerated  Diet:  Regular Tests:  NA Other:  See below  Patient is expressing readiness for discharge and there are no current grounds for involuntary commitment  Plans to return  home Plans to follow up as above . She has a PCP at [facility] for medical issues as needed   Jenne Campus, MD 08/15/2018, 1:45 PM

## 2018-08-15 NOTE — Progress Notes (Signed)
  [facility] Adult Case Management Discharge Plan :  Will you be returning to the same living situation after discharge:  Yes,  lives alone At discharge, do you have transportation home?: Yes,  arranged by patient Do you have the ability to pay for your medications: Yes,  denies having any barriers  Release of information consent forms completed and turned in to Medical Records by CSW.   Patient to Follow up at: [facility]. Go on 08/25/2018.   Why:  Please attend your medication appt with Leanor Kail on Tuesday, 08/25/18, at 12:30.  Please call mood treatment within 24 hours of discharge from [facility] to pay $20 deposit. Contact information: [address],  [place], [place] [address] P: [place]. Go on 08/21/2018.   Why:  Please attend your therapy appt with Cam Hines on Friday, 08/21/18, at 12:00noon.   Contact information: [place], [place] [address] P; [phone]          Next level of care provider has access to [place] and Suicide Prevention discussed: Yes,  with mother  Have you used any form of tobacco in the last 30 days? (Cigarettes, Smokeless Tobacco, Cigars, and/or Pipes): No  Has patient been referred to the Quitline?: N/A patient is not a smoker  Patient has been referred for addiction treatment: N/A  Maretta Los, LCSW 08/15/2018, 3:55 PM

## 2018-08-15 NOTE — Discharge Summary (Addendum)
Physician Discharge Summary Note  Patient:  Patty Garcia is an 22 y.o., female MRN:  [id number] DOB:  [birth year] Patient phone:  [phone] (home)  Patient address:   [address] [place] [address],  Total Time spent with patient: 30 minutes  Date of Admission:  08/11/2018 Date of Discharge:  08/15/2018  Reason for Admission: Per assessment admission note: 22 year old single female, employed. Presented to the hospital voluntarily, due to worsening depression , suicidal ideations of overdosing . States she feels she has been chronically depressed, although states " I have a few good days sometimes, but most of the time I feel depressed". States she has been feeling lonely," unimportant " and  that " nobody really cares" about her, and also reports that she just recently started a new job which has been stressful because coworkers are distant and unfriendly. States depressive symptoms have tended to worsen and states " I knew I needed help". Endorses neuro-vegetative symptoms of depression as below.Denies psychotic symptoms. Of note, reports she attempted suicide in January 2019, by overdosing on medications that had been prescribed to her following tooth extraction, but states " I just woke up", and did not consult at the time.  Principal Problem: Severe recurrent major depression without psychotic features [facility]) Discharge Diagnoses: Patient Active Problem List   Diagnosis Date Noted  . Severe recurrent major depression without psychotic features ([place]) [F33.2] 08/11/2018    Past Psychiatric History:   Past Medical History:  Past Medical History:  Diagnosis Date  . Blood transfusion without reported diagnosis   . Sickle cell trait [facility])     Past Surgical History:  Procedure Laterality Date  . HERNIA REPAIR    . R ovanian dermoid cyst removed  06/10/13   Family History: History reviewed. No pertinent family history. Family Psychiatric  History:  Social History:  Social  History   Substance and Sexual Activity  Alcohol Use Yes  . Alcohol/week: 2.0 - 4.0 standard drinks  . Types: 2 - 4 Glasses of wine per week     Social History   Substance and Sexual Activity  Drug Use No    Social History   Socioeconomic History  . Marital status: Single    Spouse name: Not on file  . Number of children: Not on file  . Years of education: Not on file  . Highest education level: Not on file  Occupational History  . Not on file  Social Needs  . Financial resource strain: Not on file  . Food insecurity:    Worry: Not on file    Inability: Not on file  . Transportation needs:    Medical: Not on file    Non-medical: Not on file  Tobacco Use  . Smoking status: Never Smoker  . Smokeless tobacco: Never Used  Substance and Sexual Activity  . Alcohol use: Yes    Alcohol/week: 2.0 - 4.0 standard drinks    Types: 2 - 4 Glasses of wine per week  . Drug use: No  . Sexual activity: Never    Birth control/protection: None  Lifestyle  . Physical activity:    Days per week: Not on file    Minutes per session: Not on file  . Stress: Not on file  Relationships  . Social connections:    Talks on phone: Not on file    Gets together: Not on file    Attends religious service: Not on file    Active member of club  or organization: Not on file    Attends meetings of clubs or organizations: Not on file    Relationship status: Not on file  Other Topics Concern  . Not on file  Social History Narrative  . Not on file    Hospital Course:  Patty Monte MIREILLE LACOMBE was admitted for Severe recurrent major depression without psychotic features (HCC)and crisis management.  Pt was treated discharged with the medications listed below under Medication List.  Medical problems were identified and treated as needed.  Home medications were restarted as appropriate.  Improvement was monitored by observation and Patty Athena Masse 's daily report of symptom reduction.  Emotional and mental  status was monitored by daily self-inventory reports completed by Patty Athena Masse and clinical staff.         Patty Monte LAKAYLA BARRINGTON was evaluated by the treatment team for stability and plans for continued recovery upon discharge. Patty Monte TRINIKA CORTESE 's motivation was an integral factor for scheduling further treatment. Employment, transportation, bed availability, health status, family support, and any pending legal issues were also considered during hospital stay. Pt was offered further treatment options upon discharge including but not limited to Residential, Intensive Outpatient, and Outpatient treatment.  Patty Monte AMANDALYNN PITZ will follow up with the services as listed below under Follow Up Information.     Upon completion of this admission the patient was both mentally and medically stable for discharge denying suicidal/homicidal ideation, auditory/visual/tactile hallucinations, delusional thoughts and paranoia.      Patty Monte J Rude responded well to treatment with Remeron 15 mg and Vistaril 25 mg without adverse effects. Pt demonstrated improvement without reported or observed adverse effects to the point of stability appropriate for outpatient management. Pertinent labs include: CBC for iron/ferriten level  for which outpatient follow-up is necessary for lab recheck as mentioned below. Reviewed CBC, CMP, BAL, and UDS; all unremarkable aside from noted exceptions.   Physical Findings: AIMS: Facial and Oral Movements Muscles of Facial Expression: None, normal Lips and Perioral Area: None, normal Jaw: None, normal Tongue: None, normal,Extremity Movements Upper (arms, wrists, hands, fingers): None, normal Lower (legs, knees, ankles, toes): None, normal, Trunk Movements Neck, shoulders, hips: None, normal, Overall Severity Severity of abnormal movements (highest score from questions above): None, normal Incapacitation due to abnormal movements: None, normal Patient's awareness of abnormal movements  (rate only patient's report): No Awareness, Dental Status Current problems with teeth and/or dentures?: No Does patient usually wear dentures?: No  CIWA:    COWS:     Musculoskeletal: Strength & Muscle Tone: within normal limits Gait & Station: normal Patient leans: N/A  Psychiatric Specialty Exam: See SRA by MD  Physical Exam  Nursing note and vitals reviewed. Constitutional: She is oriented to person, place, and time.  Neurological: She is alert and oriented to person, place, and time.  Psychiatric: She has a normal mood and affect. Her behavior is normal.    Review of Systems  Psychiatric/Behavioral: Negative for depression (improving ) and suicidal ideas. The patient is not nervous/anxious.   All other systems reviewed and are negative.   Blood pressure 106/65, pulse 75, temperature 99.7 F (37.6 C), temperature source Oral, resp. rate 20, height 5\' 3"  (1.6 m), weight 54.4 kg, last menstrual period 07/20/2018, SpO2 100 %.Body mass index is 21.26 kg/m.   Have you used any form of tobacco in the last 30 days? (Cigarettes, Smokeless Tobacco, Cigars, and/or Pipes): No  Has this patient used any form  of tobacco in the last 30 days? (Cigarettes, Smokeless Tobacco, Cigars, and/or Pipes)No  Blood Alcohol level:  Lab Results  Component Value Date   ETH <10 10/08/5101    Metabolic Disorder Labs:  No results found for: HGBA1C, MPG No results found for: PROLACTIN No results found for: CHOL, TRIG, HDL, CHOLHDL, VLDL, LDLCALC  See Psychiatric Specialty Exam and Suicide Risk Assessment completed by Attending Physician prior to discharge.  Discharge destination:  Home  Is patient on multiple antipsychotic therapies at discharge:  No   Has Patient had three or more failed trials of antipsychotic monotherapy by history:  No  Recommended Plan for Multiple Antipsychotic Therapies: NA  Discharge Instructions    Diet - low sodium heart healthy   Complete by:  As directed     Discharge instructions   Complete by:  As directed    Take all medications as prescribed. Keep all follow-up appointments as scheduled.  Do not consume alcohol or use illegal drugs while on prescription medications. Report any adverse effects from your medications to your primary care provider promptly.  In the event of recurrent symptoms or worsening symptoms, call 911, a crisis hotline, or go to the nearest emergency department for evaluation.   Increase activity slowly   Complete by:  As directed      Allergies as of 08/15/2018      Reactions   Blueberry Flavor Itching, Swelling   Mushroom Extract Complex Itching, Swelling      Medication List    TAKE these medications     Indication  ferrous sulfate 325 (65 FE) MG tablet Take 1 tablet (325 mg total) by mouth 2 (two) times daily with a meal.  Indication:  Anemia From Inadequate Iron in the Body   hydrOXYzine 25 MG tablet Commonly known as:  ATARAX/VISTARIL Take 1 tablet (25 mg total) by mouth 3 (three) times daily as needed for anxiety.  Indication:  Feeling Anxious   levonorgestrel 20 MCG/24HR IUD Commonly known as:  MIRENA 1 each by Intrauterine route once.  Indication:  Birth Control Treatment   mirtazapine 15 MG tablet Commonly known as:  REMERON Take 1 tablet (15 mg total) by mouth at bedtime.  Indication:  Major Depressive Disorder, Panic Disorder      [place]. Go on 08/25/2018.   Why:  Please attend your medication appt with Leanor Kail on Tuesday, 08/25/18, at 12:30.  Please call mood treatment within 24 hours of discharge from [facility] to pay $20 deposit. Contact information: [address]  [place], [place] [address] P: [place]. Go on 08/21/2018.   Why:  Please attend your therapy appt with Cam Hines on Friday, 08/21/18, at 12:00noon.   Contact information: [place]  [place], [place] [address] P; [phone]           Follow-up recommendations:  Activity:  as tolerated Diet:  heart healthy  Comments:  Take all medications as prescribed. Keep all follow-up appointments as scheduled.  Do not consume alcohol or use illegal drugs while on prescription medications. Report any adverse effects from your medications to your primary care provider promptly.  In the event of recurrent symptoms or worsening symptoms, call 911, a crisis hotline, or go to the nearest emergency department for evaluation.   Signed: Derrill Center, NP 08/15/2018, 8:59 AM   Patient seen, Suicide Assessment Completed.  Disposition Plan Reviewed

## 2018-08-15 NOTE — Progress Notes (Signed)
Patient verbalizes for discharge. Denies  SI/HI / is not psychotic or delusional . D/c instructions read to pt. All belongings returned to pt who signed for same. R- Patient  verbalize understanding of discharge instructions and sign for same.Marland Kitchen A- Escorted to lobby. Prescriptions called into [facility]

## 2018-08-15 NOTE — [facility] Group Notes (Signed)
LCSW Group Therapy Note  08/15/2018   10:00-11:00am   Type of Therapy and Topic:  Group Therapy: Anger Cues and Responses  Participation Level:  Did Not Attend   Description of Group:   In this group, patients learned how to recognize the physical, cognitive, emotional, and behavioral responses they have to anger-provoking situations.  They identified a recent time they became angry and how they reacted.  They analyzed how their reaction was possibly beneficial and how it was possibly unhelpful.  The group discussed a variety of healthier coping skills that could help with such a situation in the future.  Deep breathing was practiced briefly.  Therapeutic Goals: 1. Patients will remember their last incident of anger and how they felt emotionally and physically, what their thoughts were at the time, and how they behaved. 2. Patients will identify how their behavior at that time worked for them, as well as how it worked against them. 3. Patients will explore possible new behaviors to use in future anger situations. 4. Patients will learn that anger itself is normal and cannot be eliminated, and that healthier reactions can assist with resolving conflict rather than worsening situations.  Summary of Patient Progress:  N/A  Therapeutic Modalities:   Cognitive Behavioral Therapy  Maretta Los

## 2018-10-14 DIAGNOSIS — Z23 Encounter for immunization: Secondary | ICD-10-CM | POA: Diagnosis not present

## 2019-07-20 ENCOUNTER — Other Ambulatory Visit: Payer: Self-pay | Admitting: *Deleted

## 2019-07-20 DIAGNOSIS — Z20822 Contact with and (suspected) exposure to covid-19: Secondary | ICD-10-CM

## 2019-07-21 ENCOUNTER — Other Ambulatory Visit: Payer: Self-pay

## 2019-07-21 DIAGNOSIS — Z20822 Contact with and (suspected) exposure to covid-19: Secondary | ICD-10-CM

## 2019-07-21 NOTE — Addendum Note (Signed)
Addended by: Carlton Adam D on: 07/21/2019 09:47 AM   Modules accepted: Orders

## 2022-07-04 ENCOUNTER — Encounter (HOSPITAL_BASED_OUTPATIENT_CLINIC_OR_DEPARTMENT_OTHER): Payer: Self-pay | Admitting: Emergency Medicine

## 2022-07-04 ENCOUNTER — Emergency Department (HOSPITAL_BASED_OUTPATIENT_CLINIC_OR_DEPARTMENT_OTHER): Payer: Commercial Managed Care - PPO | Admitting: Radiology

## 2022-07-04 ENCOUNTER — Other Ambulatory Visit: Payer: Self-pay

## 2022-07-04 DIAGNOSIS — Z20822 Contact with and (suspected) exposure to covid-19: Secondary | ICD-10-CM | POA: Insufficient documentation

## 2022-07-04 DIAGNOSIS — R059 Cough, unspecified: Secondary | ICD-10-CM | POA: Insufficient documentation

## 2022-07-04 DIAGNOSIS — M791 Myalgia, unspecified site: Secondary | ICD-10-CM | POA: Diagnosis not present

## 2022-07-04 DIAGNOSIS — R112 Nausea with vomiting, unspecified: Secondary | ICD-10-CM | POA: Insufficient documentation

## 2022-07-04 DIAGNOSIS — R509 Fever, unspecified: Secondary | ICD-10-CM | POA: Diagnosis not present

## 2022-07-04 LAB — URINALYSIS, ROUTINE W REFLEX MICROSCOPIC
Bilirubin Urine: NEGATIVE
Glucose, UA: NEGATIVE mg/dL
Ketones, ur: 15 mg/dL — AB
Nitrite: NEGATIVE
Protein, ur: NEGATIVE mg/dL
Specific Gravity, Urine: 1.009 (ref 1.005–1.030)
pH: 6 (ref 5.0–8.0)

## 2022-07-04 LAB — CBC WITH DIFFERENTIAL/PLATELET
Abs Immature Granulocytes: 0.02 10*3/uL (ref 0.00–0.07)
Basophils Absolute: 0 10*3/uL (ref 0.0–0.1)
Basophils Relative: 1 %
Eosinophils Absolute: 0 10*3/uL (ref 0.0–0.5)
Eosinophils Relative: 1 %
HCT: 38.5 % (ref 36.0–46.0)
Hemoglobin: 12.8 g/dL (ref 12.0–15.0)
Immature Granulocytes: 0 %
Lymphocytes Relative: 15 %
Lymphs Abs: 0.9 10*3/uL (ref 0.7–4.0)
MCH: 25.5 pg — ABNORMAL LOW (ref 26.0–34.0)
MCHC: 33.2 g/dL (ref 30.0–36.0)
MCV: 76.7 fL — ABNORMAL LOW (ref 80.0–100.0)
Monocytes Absolute: 0.4 10*3/uL (ref 0.1–1.0)
Monocytes Relative: 6 %
Neutro Abs: 5 10*3/uL (ref 1.7–7.7)
Neutrophils Relative %: 77 %
Platelets: 378 10*3/uL (ref 150–400)
RBC: 5.02 MIL/uL (ref 3.87–5.11)
RDW: 13.2 % (ref 11.5–15.5)
WBC: 6.4 10*3/uL (ref 4.0–10.5)
nRBC: 0 % (ref 0.0–0.2)

## 2022-07-04 LAB — PREGNANCY, URINE: Preg Test, Ur: NEGATIVE

## 2022-07-04 NOTE — ED Triage Notes (Signed)
Generalized body aches, cough, n/v. "Feels like I was burning up" Did not take temp at home. Started last night.

## 2022-07-05 ENCOUNTER — Emergency Department (HOSPITAL_BASED_OUTPATIENT_CLINIC_OR_DEPARTMENT_OTHER)
Admission: EM | Admit: 2022-07-05 | Discharge: 2022-07-05 | Disposition: A | Discharge status: Home / Self Care | Payer: Commercial Managed Care - PPO | Attending: Emergency Medicine | Admitting: Emergency Medicine

## 2022-07-05 DIAGNOSIS — J111 Influenza due to unidentified influenza virus with other respiratory manifestations: Secondary | ICD-10-CM

## 2022-07-05 DIAGNOSIS — R112 Nausea with vomiting, unspecified: Secondary | ICD-10-CM

## 2022-07-05 LAB — LACTIC ACID, PLASMA: Lactic Acid, Venous: 1.6 mmol/L (ref 0.5–1.9)

## 2022-07-05 LAB — COMPREHENSIVE METABOLIC PANEL
ALT: 8 U/L (ref 0–44)
AST: 15 U/L (ref 15–41)
Albumin: 4.8 g/dL (ref 3.5–5.0)
Alkaline Phosphatase: 43 U/L (ref 38–126)
Anion gap: 14 (ref 5–15)
BUN: <5 mg/dL — ABNORMAL LOW (ref 6–20)
CO2: 20 mmol/L — ABNORMAL LOW (ref 22–32)
Calcium: 9.8 mg/dL (ref 8.9–10.3)
Chloride: 102 mmol/L (ref 98–111)
Creatinine, Ser: 0.98 mg/dL (ref 0.44–1.00)
GFR, Estimated: >60 mL/min (ref >60)
Glucose, Bld: 92 mg/dL (ref 70–99)
Potassium: 3.5 mmol/L (ref 3.5–5.1)
Sodium: 136 mmol/L (ref 135–145)
Total Bilirubin: 2.2 mg/dL — ABNORMAL HIGH (ref 0.3–1.2)
Total Protein: 8.6 g/dL — ABNORMAL HIGH (ref 6.5–8.1)

## 2022-07-05 LAB — SARS CORONAVIRUS 2 BY RT PCR: SARS Coronavirus 2 by RT PCR: NEGATIVE

## 2022-07-05 MED ORDER — KETOROLAC TROMETHAMINE 30 MG/ML IJ SOLN
30.0000 mg | Freq: Once | INTRAMUSCULAR | Status: AC
Start: 2022-07-05 — End: 2022-07-05
  Administered 2022-07-05: 30 mg via INTRAVENOUS
  Filled 2022-07-05: qty 1

## 2022-07-05 MED ORDER — LACTATED RINGERS IV BOLUS
1000.0000 mL | Freq: Once | INTRAVENOUS | Status: AC
  Administered 2022-07-05: 1000 mL via INTRAVENOUS

## 2022-07-05 MED ORDER — ONDANSETRON 8 MG PO TBDP: 8.0000 mg | ORAL_TABLET | Freq: Three times a day (TID) | ORAL | 0 refills | Status: AC | PRN

## 2022-07-05 MED ORDER — ONDANSETRON HCL 4 MG/2ML IJ SOLN
4.0000 mg | Freq: Once | INTRAMUSCULAR | Status: AC
Start: 2022-07-05 — End: 2022-07-05
  Administered 2022-07-05: 4 mg via INTRAVENOUS
  Filled 2022-07-05: qty 2

## 2022-07-05 NOTE — ED Provider Notes (Signed)
[place] EMERGENCY DEPT Provider Note   CSN: [id number] Arrival date & time: 07/04/22  2246     History  Chief Complaint  Patient presents with   Generalized Body Aches    Patty Garcia is a 26 y.o. female.  The history is provided by the patient.  She has history of sickle cell trait and comes in with onset yesterday of subjective fever, chills, sweats, cough productive of yellow sputum, generalized body aches, nausea, vomiting.  She denies any diarrhea.  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  She has not been able to take any medication at home because of the vomiting.   Home Medications Prior to Admission medications   Medication Sig Start Date End Date Taking? Authorizing Provider  ferrous sulfate 325 (65 FE) MG tablet Take 1 tablet (325 mg total) by mouth 2 (two) times daily with a meal. 08/15/18   Derrill Center, NP  hydrOXYzine (ATARAX/VISTARIL) 25 MG tablet Take 1 tablet (25 mg total) by mouth 3 (three) times daily as needed for anxiety. 08/15/18   Derrill Center, NP  levonorgestrel (MIRENA) 20 MCG/24HR IUD 1 each by Intrauterine route once.    [provider]  mirtazapine (REMERON) 15 MG tablet Take 1 tablet (15 mg total) by mouth at bedtime. 08/15/18   Derrill Center, NP      Allergies    Blueberry flavor and Mushroom extract complex    Review of Systems   Review of Systems  All other systems reviewed and are negative.   Physical Exam Updated Vital Signs BP 113/78   Pulse (!) 115   Temp 100.1 F (37.8 C)   Resp (!) 23   SpO2 96%  Physical Exam Vitals and nursing note reviewed.   26 year old female, resting comfortably and in no acute distress. Vital signs are significant for elevated heart rate and respiratory rate.  Temperature is at the upper end of normal although not technically a fever.. Oxygen saturation is 96%, which is normal. Head is normocephalic and atraumatic. PERRLA, EOMI.  Oropharynx is clear. Neck is nontender and supple without adenopathy. Back is nontender and there is no CVA tenderness. Lungs are clear without rales, wheezes, or rhonchi. Chest is nontender. Heart has regular rate and rhythm without murmur. Abdomen is soft, flat, nontender. Extremities have no cyanosis or edema, full range of motion is present. Skin is warm and dry without rash. Neurologic: Mental status is normal, cranial nerves are intact, moves all extremities equally.  ED Results / Procedures / Treatments   Labs (all labs ordered are listed, but only abnormal results are displayed) Labs Reviewed  COMPREHENSIVE METABOLIC PANEL - Abnormal; Notable for the following components:      Result Value   CO2 20 (*)    BUN <5 (*)    Total Protein 8.6 (*)    Total Bilirubin 2.2 (*)    All other components within normal limits  CBC WITH DIFFERENTIAL/PLATELET - Abnormal; Notable for the following components:   MCV 76.7 (*)    MCH 25.5 (*)    All other components within normal limits  URINALYSIS, ROUTINE W REFLEX MICROSCOPIC - Abnormal; Notable for the following components:   Hgb urine dipstick TRACE (*)    Ketones, ur 15 (*)    Leukocytes,Ua SMALL (*)    Bacteria, UA RARE (*)    All other components within normal limits  SARS CORONAVIRUS 2 BY RT PCR  LACTIC ACID, PLASMA  PREGNANCY, URINE   Radiology DG Chest 2 View  Result Date: 07/04/2022 CLINICAL DATA:  Cough, chest pain, body aches EXAM: CHEST - 2 VIEW COMPARISON:  12/17/2015 FINDINGS: The heart size and mediastinal contours are within normal limits. Both lungs are clear. The visualized skeletal structures are unremarkable. IMPRESSION: Normal study Electronically Signed   By: Rolm Baptise M.D.   On: 07/04/2022 23:44    Procedures Procedures  Cardiac monitor shows sinus tachycardia, per my interpretation.  Medications Ordered in ED Medications  ondansetron (ZOFRAN) injection 4 mg (has no administration in time range)   lactated ringers bolus 1,000 mL (has no administration in time range)  ketorolac (TORADOL) 30 MG/ML injection 30 mg (has no administration in time range)    ED Course/ Medical Decision Making/ A&P                           Medical Decision Making Amount and/or Complexity of Data Reviewed Labs: ordered. Radiology: ordered.  Risk Prescription drug management.   Cough, vomiting, subjective fevers suggestive of viral illness.  Consider influenza, RSV, COVID-19, other viral illnesses.  Consider bacterial pneumonia.  Chest x-ray shows no evidence of pneumonia.  I have independently viewed the images, and agree with radiologist's interpretation.  I have reviewed all of the laboratory tests and my interpretation is normal CBC except for borderline low MCV which is consistent with known history of sickle cell trait, mild elevation of total bilirubin possibly related to sickle cell trait, possibility Gilbert's disease (she has had elevated bilirubin documented on 10/06/2016 but has had other normal bilirubins).  With tachycardia, I am concerned about dehydration.  I have ordered IV fluids, ondansetron.  I have also ordered ketorolac for her body aches.  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  COVID 19 PCR is negative, she does not have COVID-19 infection.  She feels somewhat better following IV fluids and ondansetron.  No further nausea, heart rate has decreased.  I have sent a prescription for ondansetron oral dissolving tablet, and patient is discharged with instructions to drink plenty of fluids and take over-the-counter NSAIDs and acetaminophen as needed.  Return precautions discussed.  Final Clinical Impression(s) / ED Diagnoses Final diagnoses:  Influenza-like illness  Nausea and vomiting, unspecified vomiting type    Rx / DC Orders ED Discharge Orders          Ordered    ondansetron  (ZOFRAN-ODT) 8 MG disintegrating tablet  Every 8 hours PRN        07/05/22 0923              Delora Fuel, MD [birth year] [phone]

## 2022-07-05 NOTE — Discharge Instructions (Addendum)
Drink plenty of fluids.  Take acetaminophen and/or ibuprofen as needed for fever or aching.  Return if you have any new or concerning symptoms.
